# Patient Record
Sex: Male | Born: 1945
Health system: Southern US, Community
[De-identification: ages and names within clinical notes are randomized; demographics above are authoritative.]

## PROBLEM LIST (undated history)

## (undated) DIAGNOSIS — Z9861 Coronary angioplasty status: Secondary | ICD-10-CM

## (undated) DIAGNOSIS — Z720 Tobacco use: Secondary | ICD-10-CM

## (undated) DIAGNOSIS — G629 Polyneuropathy, unspecified: Secondary | ICD-10-CM

## (undated) DIAGNOSIS — N183 Chronic kidney disease, stage 3 (moderate): Secondary | ICD-10-CM

## (undated) DIAGNOSIS — I10 Essential (primary) hypertension: Secondary | ICD-10-CM

## (undated) DIAGNOSIS — I213 ST elevation (STEMI) myocardial infarction of unspecified site: Secondary | ICD-10-CM

## (undated) DIAGNOSIS — E785 Hyperlipidemia, unspecified: Secondary | ICD-10-CM

## (undated) DIAGNOSIS — R270 Ataxia, unspecified: Secondary | ICD-10-CM

## (undated) DIAGNOSIS — R209 Unspecified disturbances of skin sensation: Secondary | ICD-10-CM

## (undated) DIAGNOSIS — J189 Pneumonia, unspecified organism: Secondary | ICD-10-CM

## (undated) DIAGNOSIS — I2510 Atherosclerotic heart disease of native coronary artery without angina pectoris: Secondary | ICD-10-CM

## (undated) DIAGNOSIS — Z1211 Encounter for screening for malignant neoplasm of colon: Secondary | ICD-10-CM

## (undated) DIAGNOSIS — G510 Bell's palsy: Secondary | ICD-10-CM

## (undated) HISTORY — DX: Polyneuropathy, unspecified: G62.9

## (undated) HISTORY — DX: Hyperlipidemia, unspecified: E78.5

## (undated) HISTORY — PX: COLONOSCOPY: SHX174

## (undated) HISTORY — DX: Encounter for screening for malignant neoplasm of colon: Z12.11

## (undated) HISTORY — DX: Essential (primary) hypertension: I10

## (undated) HISTORY — DX: Tobacco use: Z72.0

## (undated) HISTORY — DX: Chronic kidney disease, stage 3 (moderate): N18.3

## (undated) HISTORY — DX: Pneumonia, unspecified organism: J18.9

## (undated) HISTORY — DX: Ataxia, unspecified: R27.0

## (undated) HISTORY — DX: ST elevation (STEMI) myocardial infarction of unspecified site: I21.3

## (undated) HISTORY — DX: Bell's palsy: G51.0

## (undated) HISTORY — DX: Unspecified disturbances of skin sensation: R20.9

---

## 1997-11-03 DIAGNOSIS — I2510 Atherosclerotic heart disease of native coronary artery without angina pectoris: Secondary | ICD-10-CM

## 1997-11-03 HISTORY — PX: CORONARY ANGIOPLASTY WITH STENT PLACEMENT: SHX49

## 1997-11-03 HISTORY — DX: Atherosclerotic heart disease of native coronary artery without angina pectoris: I25.10

## 1998-04-25 DIAGNOSIS — I2510 Atherosclerotic heart disease of native coronary artery without angina pectoris: Secondary | ICD-10-CM | POA: Insufficient documentation

## 2006-08-27 ENCOUNTER — Ambulatory Visit: Payer: Self-pay | Admitting: Internal Medicine

## 2006-09-10 ENCOUNTER — Ambulatory Visit: Payer: Self-pay | Admitting: Internal Medicine

## 2011-11-14 ENCOUNTER — Encounter: Payer: Self-pay | Admitting: Internal Medicine

## 2012-02-15 ENCOUNTER — Ambulatory Visit (INDEPENDENT_AMBULATORY_CARE_PROVIDER_SITE_OTHER): Payer: Medicare Other | Admitting: Family Medicine

## 2012-02-15 VITALS — BP 123/73 | HR 59 | Temp 98.2°F | Resp 16 | Ht 68.5 in | Wt 202.0 lb

## 2012-02-15 DIAGNOSIS — L259 Unspecified contact dermatitis, unspecified cause: Secondary | ICD-10-CM

## 2012-02-15 DIAGNOSIS — L309 Dermatitis, unspecified: Secondary | ICD-10-CM

## 2012-02-15 DIAGNOSIS — H6010 Cellulitis of external ear, unspecified ear: Secondary | ICD-10-CM

## 2012-02-15 DIAGNOSIS — H60399 Other infective otitis externa, unspecified ear: Secondary | ICD-10-CM

## 2012-02-15 MED ORDER — CEPHALEXIN 500 MG PO CAPS: 500.0000 mg | ORAL_CAPSULE | Freq: Three times a day (TID) | ORAL | Status: AC

## 2012-02-15 MED ORDER — TRIAMCINOLONE ACETONIDE 0.1 % EX CREA: TOPICAL_CREAM | Freq: Two times a day (BID) | CUTANEOUS | Status: AC

## 2012-02-15 NOTE — Progress Notes (Signed)
  Subjective:    Patient ID: Patrick Price, male    DOB: May 27, 1946, 66 y.o.   MRN: [id number]  HPI 66 yo male with bilateral ear pain. Itching for several weeks, drains/scabs, scratches more and it bleeds.  Now painful last few days.  Hearing okay.  No history of trouble with ear.  Does wear ear plugs.   Review of Systems    Negative except as per HPI  Objective:   Physical Exam Bilateral ear pinna red, mildly edematous, right worse than left.  Dry flaking skin with areas of erythema and scabbing.  Canals normal, TM's normal.        Assessment & Plan:  Cellulitis, ear from itching eczematous skin.  Keflex 500 TID for 7 days and TAC cream to use for the eczematous skin.

## 2012-06-08 ENCOUNTER — Ambulatory Visit: Payer: Self-pay | Admitting: Internal Medicine

## 2012-06-08 VITALS — BP 110/65 | HR 61 | Temp 97.5°F | Resp 16 | Ht 69.0 in | Wt 200.0 lb

## 2012-06-08 DIAGNOSIS — E789 Disorder of lipoprotein metabolism, unspecified: Secondary | ICD-10-CM

## 2012-06-08 DIAGNOSIS — F17200 Nicotine dependence, unspecified, uncomplicated: Secondary | ICD-10-CM

## 2012-06-08 DIAGNOSIS — Z0289 Encounter for other administrative examinations: Secondary | ICD-10-CM

## 2012-06-08 DIAGNOSIS — Z955 Presence of coronary angioplasty implant and graft: Secondary | ICD-10-CM

## 2012-06-08 NOTE — Progress Notes (Signed)
  Subjective:    Patient ID: Patrick Price, male    DOB: December 08, 1945, 66 y.o.   MRN: [id number]  HPI Here for DOT Hx stent coronary, lipid disorder, nicotine addiction All stable  Review of Systems See dot    Objective:   Physical Exam Normal for DOT       Assessment & Plan:  Chantix requested and given Agrees to schedule CPE

## 2012-07-28 ENCOUNTER — Encounter: Payer: Self-pay | Admitting: Internal Medicine

## 2013-03-09 ENCOUNTER — Other Ambulatory Visit (HOSPITAL_COMMUNITY): Payer: Self-pay | Admitting: Cardiovascular Disease

## 2013-03-09 DIAGNOSIS — I25810 Atherosclerosis of coronary artery bypass graft(s) without angina pectoris: Secondary | ICD-10-CM

## 2013-03-09 DIAGNOSIS — R079 Chest pain, unspecified: Secondary | ICD-10-CM

## 2013-03-24 ENCOUNTER — Ambulatory Visit (HOSPITAL_COMMUNITY)
Admission: RE | Admit: 2013-03-24 | Discharge: 2013-03-24 | Disposition: A | Discharge status: Home / Self Care | Payer: Medicare Other | Source: Ambulatory Visit | Attending: Cardiovascular Disease | Admitting: Cardiovascular Disease

## 2013-03-24 DIAGNOSIS — I2510 Atherosclerotic heart disease of native coronary artery without angina pectoris: Secondary | ICD-10-CM

## 2013-03-24 DIAGNOSIS — E669 Obesity, unspecified: Secondary | ICD-10-CM | POA: Insufficient documentation

## 2013-03-24 DIAGNOSIS — I25810 Atherosclerosis of coronary artery bypass graft(s) without angina pectoris: Secondary | ICD-10-CM | POA: Insufficient documentation

## 2013-03-24 DIAGNOSIS — I10 Essential (primary) hypertension: Secondary | ICD-10-CM | POA: Insufficient documentation

## 2013-03-24 DIAGNOSIS — R079 Chest pain, unspecified: Secondary | ICD-10-CM | POA: Insufficient documentation

## 2013-03-24 MED ORDER — TECHNETIUM TC 99M SESTAMIBI GENERIC - CARDIOLITE
10.7000 | Freq: Once | INTRAVENOUS | Status: AC | PRN
  Administered 2013-03-24: 11 via INTRAVENOUS

## 2013-03-24 MED ORDER — TECHNETIUM TC 99M SESTAMIBI GENERIC - CARDIOLITE
32.0000 | Freq: Once | INTRAVENOUS | Status: AC | PRN
  Administered 2013-03-24: 32 via INTRAVENOUS

## 2013-03-24 NOTE — Procedures (Addendum)
[facility] [address] [place] [address] [phone]  Cardiology Nuclear Med Study  Patrick Price is a 67 y.o. male     MRN : [id number]     DOB: [birth year]  Procedure Date: 03/24/2013  Nuclear Med Background Indication for Stress Test:  Stent Patency History:  CAD;STENT/PTCA--1999 Cardiac Risk Factors: History of Smoking, Hypertension, Lipids and Obesity  Symptoms:  PT DENIES SYMPTOMS   Nuclear Pre-Procedure Caffeine/Decaff Intake:  7:00pm NPO After: 5:00am   IV Site: R Antecubital  IV 0.9% NS with Angio Cath:  22g  Chest Size (in):  44" IV Started by: Emmit Pomfret, RN  Height: 5\' 9"  (1.753 m)  Cup Size: n/a  BMI:  Body mass index is 30.26 kg/(m^2). Weight:  205 lb (92.987 kg)   Tech Comments:  N/A    Nuclear Med Study 1 or 2 day study: 1 day  Stress Test Type:  Stress  Order Authorizing Provider:  Benny Lennert   Resting Radionuclide: Technetium 1m Sestamibi  Resting Radionuclide Dose: 10.7 mCi   Stress Radionuclide:  Technetium 19m Sestamibi  Stress Radionuclide Dose: 32.0 mCi           Stress Protocol Rest HR: 56 Stress HR: 141  Rest BP: 116/66 Stress BP: 169/71  Exercise Time (min): 11:00 METS: 12.9   Predicted Max HR: 154 bpm % Max HR: 91.56 bpm Rate Pressure Product: 21308  Dose of Adenosine (mg):  n/a Dose of Lexiscan: n/a mg  Dose of Atropine (mg): n/a Dose of Dobutamine: n/a mcg/kg/min (at max HR)  Stress Test Technologist: Esperanza Sheets, CCT Nuclear Technologist: Gonzella Lex, CNMT   Rest Procedure:  Myocardial perfusion imaging was performed at rest 45 minutes following the intravenous administration of Technetium 85m Sestamibi. Stress Procedure:  The patient performed treadmill exercise using a Bruce  Protocol for 11:00 minutes. The patient stopped due to SOB and Fatigue and denied any chest pain.  There were NSST-T wave changes.  Technetium 39m Sestamibi was injected at peak exercise  and myocardial perfusion imaging was performed after a brief delay.  Transient Ischemic Dilatation (Normal <1.22):  0.94 Lung/Heart Ratio (Normal <0.45):  0.34 QGS EDV:  77 ml QGS ESV:  23 ml LV Ejection Fraction: 70%  Signed by       Rest ECG: NSR - Normal EKG  Stress ECG: Significant ST abnormalities consistent with ischemia.  QPS Raw Data Images:  Normal; no motion artifact; normal heart/lung ratio. Stress Images:  Normal homogeneous uptake in all areas of the myocardium. Rest Images:  Normal homogeneous uptake in all areas of the myocardium. Subtraction (SDS):  No evidence of ischemia.  Impression Exercise Capacity:  Good exercise capacity. BP Response:  Normal blood pressure response. Clinical Symptoms:  No significant symptoms noted. ECG Impression:  Significant ST abnormalities consistent with ischemia. Comparison with Prior Nuclear Study: No significant change from previous study  Overall Impression:  Normal stress nuclear study.  LV Wall Motion:  NL LV Function; NL Wall Motion   Runell Gess, MD  03/24/2013 5:46 PM

## 2013-03-28 ENCOUNTER — Telehealth: Payer: Self-pay | Admitting: *Deleted

## 2013-03-28 NOTE — Telephone Encounter (Signed)
Informed patient  nuc stress test shows no changes from prior test.

## 2013-04-22 NOTE — Progress Notes (Signed)
Quick Note:  Informed patient of normal stress test results per Dr. Tresa Endo. ______

## 2013-06-08 ENCOUNTER — Other Ambulatory Visit: Payer: Self-pay

## 2013-08-19 ENCOUNTER — Telehealth: Payer: Self-pay | Admitting: Cardiovascular Disease

## 2013-08-19 DIAGNOSIS — R5381 Other malaise: Secondary | ICD-10-CM

## 2013-08-19 DIAGNOSIS — Z79899 Other long term (current) drug therapy: Secondary | ICD-10-CM

## 2013-08-19 DIAGNOSIS — E785 Hyperlipidemia, unspecified: Secondary | ICD-10-CM

## 2013-08-19 NOTE — Telephone Encounter (Signed)
Returned patient's call. Spoke with Burna Mortimer - Dr. Landry Dyke nurse, and confirmed order for complete set of labs prior to OV (cbc, cmet, tsh, lipid). Lab order placed, slips mailed to patient. Patient stated he would have labs done Monday or Tuesday prior to OV

## 2013-08-19 NOTE — Telephone Encounter (Signed)
Pt needs to have blood work done. His appointment with Dr. Tresa Endo is on Thursday the 23rd @ 10:15 he would like to have the order's sent to [facility] but wants someone to call him to let him know so that he can go on Monday or Tuesday

## 2013-08-23 LAB — LIPID PANEL
Cholesterol: 141 mg/dL (ref 0–200)
HDL: 30 mg/dL — ABNORMAL LOW (ref >39)
LDL Cholesterol: 69 mg/dL (ref 0–99)
Total CHOL/HDL Ratio: 4.7 Ratio
Triglycerides: 212 mg/dL — ABNORMAL HIGH (ref <150)
VLDL: 42 mg/dL — ABNORMAL HIGH (ref 0–40)

## 2013-08-23 LAB — CBC
HCT: 46 % (ref 39.0–52.0)
Hemoglobin: 15.8 g/dL (ref 13.0–17.0)
MCH: 30.8 pg (ref 26.0–34.0)
MCHC: 34.3 g/dL (ref 30.0–36.0)
MCV: 89.7 fL (ref 78.0–100.0)
Platelets: 162 10*3/uL (ref 150–400)
RBC: 5.13 MIL/uL (ref 4.22–5.81)
RDW: 13.9 % (ref 11.5–15.5)
WBC: 8.2 10*3/uL (ref 4.0–10.5)

## 2013-08-23 LAB — COMPREHENSIVE METABOLIC PANEL
ALT: 30 U/L (ref 0–53)
AST: 23 U/L (ref 0–37)
Albumin: 4.3 g/dL (ref 3.5–5.2)
Alkaline Phosphatase: 66 U/L (ref 39–117)
BUN: 17 mg/dL (ref 6–23)
CO2: 24 mEq/L (ref 19–32)
Calcium: 8.9 mg/dL (ref 8.4–10.5)
Chloride: 107 mEq/L (ref 96–112)
Creat: 1.14 mg/dL (ref 0.50–1.35)
Glucose, Bld: 94 mg/dL (ref 70–99)
Potassium: 4.2 mEq/L (ref 3.5–5.3)
Sodium: 138 mEq/L (ref 135–145)
Total Bilirubin: 0.7 mg/dL (ref 0.3–1.2)
Total Protein: 6.6 g/dL (ref 6.0–8.3)

## 2013-08-23 LAB — TSH: TSH: 1.738 u[IU]/mL (ref 0.350–4.500)

## 2013-08-25 ENCOUNTER — Ambulatory Visit (INDEPENDENT_AMBULATORY_CARE_PROVIDER_SITE_OTHER): Payer: Medicare Other | Admitting: Cardiovascular Disease

## 2013-08-31 ENCOUNTER — Encounter: Payer: Self-pay | Admitting: *Deleted

## 2013-09-02 ENCOUNTER — Encounter: Payer: Self-pay | Admitting: Cardiovascular Disease

## 2013-09-02 ENCOUNTER — Ambulatory Visit (INDEPENDENT_AMBULATORY_CARE_PROVIDER_SITE_OTHER): Payer: Medicare Other | Admitting: Cardiovascular Disease

## 2013-09-02 VITALS — BP 104/68 | HR 59 | Ht 69.0 in | Wt 190.5 lb

## 2013-09-02 DIAGNOSIS — E782 Mixed hyperlipidemia: Secondary | ICD-10-CM

## 2013-09-02 DIAGNOSIS — E785 Hyperlipidemia, unspecified: Secondary | ICD-10-CM

## 2013-09-02 DIAGNOSIS — I2510 Atherosclerotic heart disease of native coronary artery without angina pectoris: Secondary | ICD-10-CM

## 2013-09-02 NOTE — Progress Notes (Signed)
Patient ID: Patrick Price, male   DOB: Sep 26, 1946, 67 y.o.   MRN: [id number]     HPI: Patrick Price is a 67 y.o. male who presents for 1 year cardiology evaluation.  In 1999 Patrick Price underwent PTCA and stenting of his right coronary artery. He has a long-standing history of prior tobacco use but quit smoking approximately 7 years ago when he turns 60. He does have a history of hyperlipidemia. Since I last saw him, he underwent a two-year followup nuclear perfusion study in May 2014 which continued to demonstrate normal perfusion without scar or ischemia on his medical therapy.  He has remained active. He denies chest pain. He denies shortness of breath. He exercises at the gym at least 3 days per week.  He recently had followup laboratory which showed a sodium of 138 he had normal renal function and liver function studies. Hemoglobin and hematocrit were 15.8 and 46. Total is improved at 141 but his triglycerides have increased from one year ago and were now 212 with a reduced HDL of 30 and an increased VLDL at 42 even though his LDL cholesterol was 69. Findings are as consistent with an atherogenic dyslipidemia profile. He never did start  Zetia last year but has been taking atorvastatin 40 mg and fish oil 2000 mg per day. He has tolerated his metoprolol 25 mg long acting regimen. He denies recent chest pain. He denies shortness of breath. He denies change in exercise tolerance  Past Medical History  Diagnosis Date  . Hyperlipidemia   . Hypertension   . Stented coronary artery   . Coronary artery disease 01/16/11    nuclear stress test- low risk scan. no ischemia or infarct/scar is seen EF 77%    Past Surgical History  Procedure Laterality Date  . Coronary angioplasty      No Known Allergies  Current Outpatient Prescriptions  Medication Sig Dispense Refill  . aspirin 325 MG tablet Take 325 mg by mouth daily.      [name] atorvastatin (LIPITOR) 40 MG tablet Take 1 tablet by mouth daily.      [name] b  complex vitamins tablet Take 1 tablet by mouth daily.      . fish oil-omega-3 fatty acids 1000 MG capsule Take 2 g by mouth daily.      . metoprolol succinate (TOPROL-XL) 25 MG 24 hr tablet Take 1 tablet by mouth daily.      . Multiple Vitamin (MULTIVITAMIN WITH MINERALS) TABS tablet Take 1 tablet by mouth daily.       No current facility-administered medications for this visit.    History   Social History  . Marital Status: Married    Spouse Name: N/A    Number of Children: N/A  . Years of Education: N/A   Occupational History  . Not on file.   Social History Main Topics  . Smoking status: Former Smoker -- 0.10 packs/day    Types: Cigarettes    Quit date: 02/15/2007  . Smokeless tobacco: Not on file  . Alcohol Use: Not on file  . Drug Use: Not on file  . Sexual Activity: Not on file   Other Topics Concern  . Not on file   Social History Narrative  . No narrative on file   Socially he is married and has 2 children. 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  No family history on file.  ROS is negative for fevers,  chills or night sweats. He denies visual symptoms. He denies cough or sputum production. He denies wheezing. He denies presyncope or syncope. There is no chest pain. There is no shortness of breath. He denies abdominal pain. There is no nausea vomiting or diarrhea. He denies GU or GI symptoms. He denies endocrine abnormalities. He does have hyperlipidemia. He denies claudication. He denies neurologic symptoms.  Other comprehensive 12 point system review is negative.  PE BP 104/68  Pulse 59  Ht 5\' 9"  (1.753 m)  Wt 190 lb 8 oz (86.41 kg)  BMI 28.12 kg/m2  General: Alert, oriented, no distress.  Skin: normal turgor, no rashes HEENT: Normocephalic, atraumatic. Pupils round and reactive; sclera anicteric;no lid lag.  Nose without nasal septal hypertrophy Mouth/Parynx benign; Mallinpatti scale 2 Neck: No JVD, no  carotid briuts Lungs: clear to ausculatation and percussion; no wheezing or rales Heart: RRR, s1 s2 normal 1/6 sem Abdomen: soft, nontender; no hepatosplenomehaly, BS+; abdominal aorta nontender and not dilated by palpation. Pulses 2+ Extremities: no clubbing cyanosis or edema, Homan's sign negative  Neurologic: grossly nonfocal Psychologic: normal affect and mood.  ECG:  Sinus rhythm at 59 beats per minute. Nonspecific T changes.  LABS:  BMET    Component Value Date/Time   NA 138 08/23/2013 0935   K 4.2 08/23/2013 0935   CL 107 08/23/2013 0935   CO2 24 08/23/2013 0935   GLUCOSE 94 08/23/2013 0935   BUN 17 08/23/2013 0935   CREATININE 1.14 08/23/2013 0935   CALCIUM 8.9 08/23/2013 0935     Hepatic Function Panel     Component Value Date/Time   PROT 6.6 08/23/2013 0935   ALBUMIN 4.3 08/23/2013 0935   AST 23 08/23/2013 0935   ALT 30 08/23/2013 0935   ALKPHOS 66 08/23/2013 0935   BILITOT 0.7 08/23/2013 0935     CBC    Component Value Date/Time   WBC 8.2 08/23/2013 0935   RBC 5.13 08/23/2013 0935   HGB 15.8 08/23/2013 0935   HCT 46.0 08/23/2013 0935   PLT 162 08/23/2013 0935   MCV 89.7 08/23/2013 0935   MCH 30.8 08/23/2013 0935   MCHC 34.3 08/23/2013 0935   RDW 13.9 08/23/2013 0935     BNP No results found for this basename: probnp    Lipid Panel     Component Value Date/Time   CHOL 141 08/23/2013 0935   TRIG 212* 08/23/2013 0935   HDL 30* 08/23/2013 0935   CHOLHDL 4.7 08/23/2013 0935   VLDL 42* 08/23/2013 0935   LDLCALC 69 08/23/2013 0935     RADIOLOGY: No results found.    ASSESSMENT AND PLAN:  From a cardiac perspective, Patrick Price is doing well now 15 years status post stenting of his right coronary artery. His most recent nuclear study is unchanged from previously and continues to be normal. His triglycerides have increased over the year. I have suggested he increase his visual tube 2 capsules twice a day. We discussed reduction of  carbohydrate intake. He denies eating any sweets or awareness of sugar intake. He'll continue to exercise regularly. In 6 months, I will check an NMR lipoprofile for further evaluation of particle number and it seemed that. I will review this and adjust as will be made if necessary. Otherwise I will see him in one year for cardiology reevaluation.   Lennette Bihari, MD, [facility]  09/02/2013 9:18 AM

## 2013-09-02 NOTE — Patient Instructions (Signed)
Your physician has recommended you make the following change in your medication: Increase the fish oil to 2 twice daily.   Your physician recommends that you schedule a follow-up appointment in: 1 YEAR.

## 2013-09-08 ENCOUNTER — Other Ambulatory Visit: Payer: Self-pay

## 2013-09-22 ENCOUNTER — Encounter: Payer: Self-pay | Admitting: Cardiovascular Disease

## 2013-10-01 ENCOUNTER — Ambulatory Visit (INDEPENDENT_AMBULATORY_CARE_PROVIDER_SITE_OTHER): Payer: Medicare Other | Admitting: Internal Medicine

## 2013-10-01 VITALS — BP 114/62 | HR 58 | Temp 98.0°F | Resp 16 | Ht 69.0 in | Wt 190.0 lb

## 2013-10-01 DIAGNOSIS — M7040 Prepatellar bursitis, unspecified knee: Secondary | ICD-10-CM

## 2013-10-01 DIAGNOSIS — M109 Gout, unspecified: Secondary | ICD-10-CM

## 2013-10-01 DIAGNOSIS — M7041 Prepatellar bursitis, right knee: Secondary | ICD-10-CM

## 2013-10-01 LAB — POCT CBC
Granulocyte percent: 64.4 %G (ref 37–80)
HCT, POC: 41.9 % — AB (ref 43.5–53.7)
Hemoglobin: 13.3 g/dL — AB (ref 14.1–18.1)
Lymph, poc: 2.1 (ref 0.6–3.4)
MCH, POC: 30.7 pg (ref 27–31.2)
MCHC: 31.7 g/dL — AB (ref 31.8–35.4)
MCV: 96.7 fL (ref 80–97)
MID (cbc): 0.4 (ref 0–0.9)
MPV: 10.1 fL (ref 0–99.8)
POC Granulocyte: 4.6 (ref 2–6.9)
POC LYMPH PERCENT: 29.4 %L (ref 10–50)
POC MID %: 6.2 %M (ref 0–12)
Platelet Count, POC: 128 10*3/uL — AB (ref 142–424)
RBC: 4.33 M/uL — AB (ref 4.69–6.13)
RDW, POC: 14.1 %
WBC: 7.1 10*3/uL (ref 4.6–10.2)

## 2013-10-01 LAB — URIC ACID: Uric Acid, Serum: 6.2 mg/dL (ref 4.0–7.8)

## 2013-10-01 MED ORDER — PREDNISONE 20 MG PO TABS: ORAL_TABLET | ORAL | Status: DC

## 2013-10-01 NOTE — Progress Notes (Addendum)
Subjective:    Patient ID: Patrick Price, male    DOB: February 21, 1946, 67 y.o.   MRN: [id number] This chart was scribed for Ellamae Sia, MD by Clydene Laming, ED Scribe. This patient was seen in room 3 and the patient's care was started at 11:29 AM. HPI HPI Comments: Patrick Price is a 67 y.o. male who presents to the [facility] complaining of right knee pain onset one week ago. Pt denies any trauma. He states he can barely move the right knee at this point. There is pain to palpation although he states it does not hurt to walk on it. Pt was on a high protein diet and lost 30 lbs in 10 weeks. Tender to light touch like garments and sheets. No hx gout   Patient Active Problem List   Diagnosis Date Noted   Hyperlipidemia LDL goal < 70 09/02/2013   Stented coronary artery 06/08/2012   Lipid disorder 06/08/2012   Nicotine addiction 06/08/2012    Past Surgical History  Procedure Laterality Date   Coronary angioplasty     No Known Allergies Prior to Admission medications   Medication Sig Start Date End Date Taking? Authorizing Provider  aspirin 325 MG tablet Take 325 mg by mouth daily.   Yes Historical Provider, MD  atorvastatin (LIPITOR) 40 MG tablet Take 1 tablet by mouth daily. 07/12/13  Yes Historical Provider, MD  b complex vitamins tablet Take 1 tablet by mouth daily.   Yes Historical Provider, MD  fish oil-omega-3 fatty acids 1000 MG capsule Take 2 g by mouth daily.   Yes Historical Provider, MD  metoprolol succinate (TOPROL-XL) 25 MG 24 hr tablet Take 1 tablet by mouth daily. 07/12/13  Yes Historical Provider, MD  Multiple Vitamin (MULTIVITAMIN WITH MINERALS) TABS tablet Take 1 tablet by mouth daily.   Yes Historical Provider, MD      Review of Systems  Constitutional: Negative for fever.  Musculoskeletal: Positive for joint swelling and myalgias. Negative for back pain.  Skin: Negative for rash.       Objective:   Physical Exam  Nursing note and vitals  reviewed. Constitutional: He is oriented to person, place, and time. He appears well-developed and well-nourished.  HENT:  Head: Normocephalic and atraumatic.  Cardiovascular: Normal rate and regular rhythm.   Pulmonary/Chest: Effort normal.  Abdominal: Bowel sounds are normal.  Musculoskeletal: He exhibits no edema.  R Knee sl swollen over prepatellar area with exquisite tenderness to touch. Pain w/ extens knee. Pat ballots freely Neg lax to stressors McM neg  Neurological: He is alert and oriented to person, place, and time. He has normal strength. No cranial nerve deficit or sensory deficit.  Skin: Skin is warm and dry. No rash noted.  Psychiatric: He has a normal mood and affect.   Filed Vitals:   10/01/13 1120  BP: 114/62  Pulse: 58  Temp: 98 F (36.7 C)  Resp: 16  Height: 5\' 9"  (1.753 m)  Weight: 190 lb (86.183 kg)  SpO2: 99%      Results for orders placed in visit on 10/01/13  POCT CBC      Result Value Range   WBC 7.1  4.6 - 10.2 K/uL   Lymph, poc 2.1  0.6 - 3.4   POC LYMPH PERCENT 29.4  10 - 50 %L   MID (cbc) 0.4  0 - 0.9   POC MID % 6.2  0 - 12 %M   POC Granulocyte 4.6  2 - 6.9  Granulocyte percent 64.4  37 - 80 %G   RBC 4.33 (*) 4.69 - 6.13 M/uL   Hemoglobin 13.3 (*) 14.1 - 18.1 g/dL   HCT, POC 82.9 (*) 56.2 - 53.7 %   MCV 96.7  80 - 97 fL   MCH, POC 30.7  27 - 31.2 pg   MCHC 31.7 (*) 31.8 - 35.4 g/dL   RDW, POC 13.0     Platelet Count, POC 128 (*) 142 - 424 K/uL   MPV 10.1  0 - 99.8 fL  recent cmet at PCP wnl     Assessment & Plan:  11:36 AM- Discussed treatment plan with pt at bedside. Pt verbalized understanding and agreement with plan.  I have completed the patient encounter in its entirety as documented by the scribe, with editing by me where necessary. Robert P. Merla Riches, M.D.  Prepatellar bursitis, right - Plan: POCT CBC, Uric acid  Gout  Meds ordered this encounter  Medications   predniSONE (DELTASONE) 20 MG tablet    Sig:  3/3/2/2/1/1 single daily dose for 6 days    Dispense:  12 tablet    Refill:  0

## 2013-10-02 ENCOUNTER — Encounter: Payer: Self-pay | Admitting: Internal Medicine

## 2013-12-26 ENCOUNTER — Other Ambulatory Visit: Payer: Self-pay | Admitting: Cardiovascular Disease

## 2013-12-26 NOTE — Telephone Encounter (Signed)
E sentrx 

## 2014-01-26 ENCOUNTER — Encounter: Payer: Self-pay | Admitting: *Deleted

## 2014-01-26 ENCOUNTER — Other Ambulatory Visit: Payer: Self-pay | Admitting: *Deleted

## 2014-01-26 DIAGNOSIS — I2510 Atherosclerotic heart disease of native coronary artery without angina pectoris: Secondary | ICD-10-CM

## 2014-01-26 DIAGNOSIS — E782 Mixed hyperlipidemia: Secondary | ICD-10-CM

## 2014-05-20 ENCOUNTER — Ambulatory Visit (INDEPENDENT_AMBULATORY_CARE_PROVIDER_SITE_OTHER): Payer: Medicare Other | Admitting: Emergency Medicine

## 2014-05-20 VITALS — BP 118/72 | HR 66 | Temp 98.0°F | Resp 18 | Ht 69.0 in | Wt 198.0 lb

## 2014-05-20 DIAGNOSIS — T1590XA Foreign body on external eye, part unspecified, unspecified eye, initial encounter: Secondary | ICD-10-CM

## 2014-05-20 MED ORDER — TOBRAMYCIN 0.3 % OP SOLN: 2.0000 [drp] | OPHTHALMIC | Status: DC

## 2014-05-20 MED ORDER — CIPROFLOXACIN HCL 0.3 % OP SOLN: 2.0000 [drp] | OPHTHALMIC | Status: DC

## 2014-05-20 NOTE — Patient Instructions (Signed)
Eye, Foreign Body The term foreign body refers to any object near, on the surface of or in the eye that should not be there. A foreign body may be a small speck of dirt or dust, a hair or eyelash, a splinter or any object. CAUSES  Foreign bodies can get in the eye by:  Flying pieces of something that was broken or destroyed (debris).  A sudden injury (trauma) to the eye. SYMPTOMS  Symptoms depend on what the foreign body is and where it is in the eye. The most common locations are:  On the inner surface of the upper or lower eyelids or on the covering of the white part of the eye (conjunctiva). Symptoms in this location are:  Irritating and painful, especially when blinking.  Feeling like something is in the eye.  On the surface of the clear covering on the front of the eye (cornea). A corneal foreign body has symptoms that:  Are painful and irritating since the cornea is very sensitive.  Form small "rust rings" around a metallic foreign body. Metallic foreign bodies stick more firmly to the surface of the cornea.  Inside the eyeball. Infection can happen fast and can be hard to treat with antibiotics. This is an extremely dangerous situation. Foreign bodies inside the eye can threaten vision. A person may even loose their eye. Foreign bodies inside the eye may cause:  Great pain.  Immediate loss of vision. DIAGNOSIS  Foreign bodies are found during an exam by an eye specialist. Those that are on the eyelids, conjunctiva or cornea are usually (but not always) easily found. When a foreign body is inside the eyeball, a cataract may form almost right away. This makes it hard for an ophthalmologist to find the foreign body. Special tests may be needed, including ultrasound testing, X-rays and CT scans. TREATMENT   Foreign bodies that are on the eyelids, conjunctiva or cornea are often removed easily and painlessly.  If the foreign body has caused a scratch or abrasion of the cornea,  antibiotic drops, ointments and/or a tight patch called a "pressure patch" may be needed. Follow-up exams will be needed for several days until the abrasion heals.  Surgery is needed right away if the foreign body is inside the eyeball. This is a medical emergency. An antibiotic therapy will likely be given to stop an infection. HOME CARE INSTRUCTIONS  The use of eye patches is not universal. Their use varies from state to state and from caregiver to caregiver. If an eye patch was applied:  Keep the eye patch on for as long as directed by your caregiver until the follow-up appointment.  Do not remove the patch to put in medications unless instructed to do so. When replacing the patch, retape it as it was before. Follow the same procedure if the patch becomes loose.  WARNING: Do not drive or operate machinery while the eye is patched. The ability to judge distances will be impaired.  Only take over-the-counter or prescription medicines for pain, discomfort or fever as directed by the caregiver. If no eye patch was applied:  Keep the eye closed as much as possible. Do not rub the eye.  Wear dark glasses as needed to protect the eyes from bright light.  Do not wear contact lenses until the eye feels normal again, or as instructed.  Wear protective eye covering if there is a risk of eye injury. This is important when working with high speed tools.  Only take over-the-counter or   prescription medicines for pain, discomfort or fever as directed by the caregiver. SEEK IMMEDIATE MEDICAL CARE IF:   Pain increases in the eye or the vision changes.  You or your child has problems with the eye patch.  The injury to the eye appears to be getting larger.  There is discharge from the injured eye.  Swelling and/or soreness (inflammation) develops around the affected eye.  You or your child has an oral temperature above 102 F (38.9 C), not controlled by medicine.  Your baby is older than 3  months with a rectal temperature of 102 F (38.9 C) or higher.  Your baby is 3 months old or younger with a rectal temperature of 100.4 F (38 C) or higher. MAKE SURE YOU:   Understand these instructions.  Will watch your condition.  Will get help right away if you are not doing well or get worse. Document Released: 10/20/2005 Document Revised: 01/12/2012 Document Reviewed: 03/17/2013 ExitCare Patient Information 2015 [employer]. This information is not intended to replace advice given to you by your health care provider. Make sure you discuss any questions you have with your health care provider.  

## 2014-05-20 NOTE — Progress Notes (Signed)
[facility] [address], [place] [id number] [phone]  Date:  05/20/2014   Name:  Patrick Price   DOB:  10/16/1946   MRN:  [id number]  PCP:  Tally DueGUEST, CHRIS WARREN, MD    Chief Complaint: Conjunctivitis   History of Present Illness:  Patrick MinkJack Hao is a 68 y.o. 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  No photophobia.  Scant drainange.  No known FB eye.   No improvement with over the counter medications or other home remedies. Denies other complaint or health concern today.   Patient Active Problem List   Diagnosis Date Noted  . Hyperlipidemia LDL goal < 70 09/02/2013  . Stented coronary artery 06/08/2012  . Lipid disorder 06/08/2012  . Nicotine addiction 06/08/2012    Past Medical History  Diagnosis Date  . Hyperlipidemia   . Hypertension   . Stented coronary artery   . Coronary artery disease 01/16/11    nuclear stress test- low risk scan. no ischemia or infarct/scar is seen EF 77%    Past Surgical History  Procedure Laterality Date  . Coronary angioplasty      History  Substance Use Topics  . Smoking status: Former Smoker -- 0.10 packs/day    Types: Cigarettes    Quit date: 02/15/2007  . Smokeless tobacco: Not on file  . Alcohol Use: Not on file    History reviewed. No pertinent family history.  No Known Allergies  Medication list has been reviewed and updated.  Current Outpatient Prescriptions on File Prior to Visit  Medication Sig Dispense Refill  . aspirin 325 MG tablet Take 325 mg by mouth daily.      [name]. atorvastatin (LIPITOR) 40 MG tablet Take 1 tablet by mouth daily.      [name]. b complex vitamins tablet Take 1 tablet by mouth daily.      . fish oil-omega-3 fatty acids 1000 MG capsule Take 2 g by mouth daily.      . metoprolol succinate (TOPROL-XL) 25 MG 24 hr tablet TAKE 1 TABLET ONCE DAILY.  60 tablet  6  . Multiple Vitamin  (MULTIVITAMIN WITH MINERALS) TABS tablet Take 1 tablet by mouth daily.      . predniSONE (DELTASONE) 20 MG tablet 3/3/2/2/1/1 single daily dose for 6 days  12 tablet  0   No current facility-administered medications on file prior to visit.    Review of Systems:  As per HPI, otherwise negative.    Physical Examination: Filed Vitals:   05/20/14 1352  BP: 118/72  Pulse: 66  Temp: 98 F (36.7 C)  Resp: 18   Filed Vitals:   05/20/14 1352  Height: 5\' 9"  (1.753 m)  Weight: 198 lb (89.812 kg)   Body mass index is 29.23 kg/(m^2). Ideal Body Weight: Weight in (lb) to have BMI = 25: 168.9   GEN: WDWN, NAD, Non-toxic, Alert & Oriented x 3 HEENT: Atraumatic, Normocephalic.  Ears and Nose: No external deformity. EXTR: No clubbing/cyanosis/edema NEURO: Normal gait.  PSYCH: Normally interactive. Conversant. Not depressed or anxious appearing.  Calm demeanor.  No visible FB Irrigated 1000 ml nss each eye with morgan lense  Assessment and Plan: Bilateral foreign body Signed,  Phillips OdorJeffery Shaela Boer, MD

## 2014-07-04 ENCOUNTER — Other Ambulatory Visit: Payer: Self-pay | Admitting: Cardiovascular Disease

## 2014-07-04 NOTE — Telephone Encounter (Signed)
Rx was sent to pharmacy electronically. 

## 2014-07-21 ENCOUNTER — Encounter: Payer: Self-pay | Admitting: *Deleted

## 2014-07-27 ENCOUNTER — Encounter: Payer: Self-pay | Admitting: *Deleted

## 2014-08-08 ENCOUNTER — Telehealth: Payer: Self-pay | Admitting: *Deleted

## 2014-08-08 NOTE — Telephone Encounter (Signed)
[facility] (generic info-name, title, location, contact info)

## 2014-10-12 ENCOUNTER — Ambulatory Visit (INDEPENDENT_AMBULATORY_CARE_PROVIDER_SITE_OTHER): Payer: Medicare Other | Admitting: Emergency Medicine

## 2014-10-12 VITALS — BP 118/56 | HR 70 | Temp 98.2°F | Resp 16 | Ht 69.0 in | Wt 201.6 lb

## 2014-10-12 DIAGNOSIS — J209 Acute bronchitis, unspecified: Secondary | ICD-10-CM

## 2014-10-12 DIAGNOSIS — J029 Acute pharyngitis, unspecified: Secondary | ICD-10-CM

## 2014-10-12 DIAGNOSIS — H109 Unspecified conjunctivitis: Secondary | ICD-10-CM

## 2014-10-12 MED ORDER — OFLOXACIN 0.3 % OP SOLN: 1.0000 [drp] | Freq: Four times a day (QID) | OPHTHALMIC | Status: DC

## 2014-10-12 MED ORDER — HYDROCOD POLST-CHLORPHEN POLST 10-8 MG/5ML PO LQCR: 5.0000 mL | Freq: Two times a day (BID) | ORAL | Status: DC | PRN

## 2014-10-12 MED ORDER — CLARITHROMYCIN 500 MG PO TABS: 500.0000 mg | ORAL_TABLET | Freq: Two times a day (BID) | ORAL | Status: DC

## 2014-10-12 NOTE — Patient Instructions (Signed)

## 2014-10-12 NOTE — Progress Notes (Signed)
[facility] [address], [place] [id number] [phone]  Date:  10/12/2014   Name:  Patrick Price   DOB:  03/01/1946   MRN:  [id number]  PCP:  Tally DueGUEST, CHRIS WARREN, MD    Chief Complaint: URI; Eye Pain; and Cough   History of Present Illness:  Patrick Price is a 68 y.o. very pleasant male patient who presents with the following:  Had a "cold" three days ago.  Now has a persistent non productive cough.  No wheezing or shortness of breath No nasal congestion or drainage.  No post nasal drip. No fever or chills Now has tearing and redness in his eyes and gluing this morning. No history of allergies. No improvement with over the counter medications or other home remedies.  Denies other complaint or health concern today.   Patient Active Problem List   Diagnosis Date Noted  . Hyperlipidemia LDL goal < 70 09/02/2013  . Stented coronary artery 06/08/2012  . Lipid disorder 06/08/2012  . Nicotine addiction 06/08/2012    Past Medical History  Diagnosis Date  . Hyperlipidemia   . Hypertension   . Stented coronary artery   . Coronary artery disease 01/16/11    nuclear stress test- low risk scan. no ischemia or infarct/scar is seen EF 77%    Past Surgical History  Procedure Laterality Date  . Coronary angioplasty      History  Substance Use Topics  . Smoking status: Former Smoker -- 0.10 packs/day    Types: Cigarettes    Quit date: 02/15/2007  . Smokeless tobacco: Not on file  . Alcohol Use: Not on file    No family history on file.  No Known Allergies  Medication list has been reviewed and updated.  Current Outpatient Prescriptions on File Prior to Visit  Medication Sig Dispense Refill  . aspirin 325 MG tablet Take 325 mg by mouth daily.    [name]. atorvastatin (LIPITOR) 40 MG tablet TAKE 1 TABLET BY MOUTH AT BEDTIME 90 tablet 0  . b complex vitamins tablet Take 1 tablet by mouth daily.    . ciprofloxacin (CILOXAN) 0.3 % ophthalmic solution Place 2  drops into both eyes every 4 (four) hours while awake. Administer 1 drop, every 2 hours, while awake, for 2 days. Then 1 drop, every 4 hours, while awake, for the next 5 days. 5 mL 0  . fish oil-omega-3 fatty acids 1000 MG capsule Take 2 g by mouth daily.    . metoprolol succinate (TOPROL-XL) 25 MG 24 hr tablet TAKE 1 TABLET ONCE DAILY. 60 tablet 6  . Multiple Vitamin (MULTIVITAMIN WITH MINERALS) TABS tablet Take 1 tablet by mouth daily.    [name]. tobramycin (TOBREX) 0.3 % ophthalmic solution Place 2 drops into both eyes every 4 (four) hours. 5 mL 0   No current facility-administered medications on file prior to visit.    Review of Systems:  As per HPI, otherwise negative.    Physical Examination: Filed Vitals:   10/12/14 1003  BP: 118/56  Pulse: 70  Temp: 98.2 F (36.8 C)  Resp: 16   Filed Vitals:   10/12/14 1003  Height: 5\' 9"  (1.753 m)  Weight: 201 lb 9.6 oz (91.445 kg)   Body mass index is 29.76 kg/(m^2). Ideal Body Weight: Weight in (lb) to have BMI = 25: 168.9  GEN: WDWN, NAD, Non-toxic, A & O x 3  Persistent cough HEENT: Atraumatic, Normocephalic. Neck supple. No masses, No LAD.  Injected sclera and  crusty lids  Oropharynx beefy red Ears and Nose: No external deformity. CV: RRR, No M/G/R. No JVD. No thrill. No extra heart sounds. PULM: CTA B, no wheezes, crackles, rhonchi. No retractions. No resp. distress. No accessory muscle use. ABD: S, NT, ND, +BS. No rebound. No HSM. EXTR: No c/c/e NEURO Normal gait.  PSYCH: Normally interactive. Conversant. Not depressed or anxious appearing.  Calm demeanor.    Assessment and Plan: Conjunctivitis Pharyngitis Bronchitis Ocuflox tussionex biaxin  Signed,  Phillips OdorJeffery Vipul Cafarelli, MD

## 2014-10-28 ENCOUNTER — Other Ambulatory Visit: Payer: Self-pay | Admitting: Cardiovascular Disease

## 2014-10-30 NOTE — Telephone Encounter (Signed)
Rx(s) sent to pharmacy electronically.  

## 2014-11-03 HISTORY — PX: CARDIOVASCULAR STRESS TEST: SHX262

## 2015-01-01 ENCOUNTER — Encounter: Payer: Self-pay | Admitting: *Deleted

## 2015-01-29 ENCOUNTER — Other Ambulatory Visit: Payer: Self-pay | Admitting: Cardiovascular Disease

## 2015-01-29 NOTE — Telephone Encounter (Signed)
Rx refill sent to patient pharmacy   

## 2015-02-01 ENCOUNTER — Emergency Department (HOSPITAL_COMMUNITY): Payer: Medicare Other

## 2015-02-01 ENCOUNTER — Encounter: Payer: Self-pay | Admitting: Family Medicine

## 2015-02-01 ENCOUNTER — Emergency Department (HOSPITAL_COMMUNITY)
Admission: EM | Admit: 2015-02-01 | Discharge: 2015-02-01 | Disposition: A | Discharge status: Home / Self Care | Payer: Medicare Other | Attending: Emergency Medicine | Admitting: Emergency Medicine

## 2015-02-01 ENCOUNTER — Ambulatory Visit (INDEPENDENT_AMBULATORY_CARE_PROVIDER_SITE_OTHER): Payer: Medicare Other | Admitting: Family Medicine

## 2015-02-01 ENCOUNTER — Encounter (HOSPITAL_COMMUNITY): Payer: Self-pay | Admitting: Family Medicine

## 2015-02-01 VITALS — BP 160/84 | HR 65 | Temp 98.3°F | Resp 16 | Ht 69.0 in | Wt 201.0 lb

## 2015-02-01 DIAGNOSIS — Z87891 Personal history of nicotine dependence: Secondary | ICD-10-CM | POA: Insufficient documentation

## 2015-02-01 DIAGNOSIS — R519 Headache, unspecified: Secondary | ICD-10-CM

## 2015-02-01 DIAGNOSIS — G510 Bell's palsy: Secondary | ICD-10-CM | POA: Insufficient documentation

## 2015-02-01 DIAGNOSIS — Z955 Presence of coronary angioplasty implant and graft: Secondary | ICD-10-CM | POA: Insufficient documentation

## 2015-02-01 DIAGNOSIS — I10 Essential (primary) hypertension: Secondary | ICD-10-CM

## 2015-02-01 DIAGNOSIS — Z79899 Other long term (current) drug therapy: Secondary | ICD-10-CM | POA: Diagnosis not present

## 2015-02-01 DIAGNOSIS — Z7982 Long term (current) use of aspirin: Secondary | ICD-10-CM | POA: Diagnosis not present

## 2015-02-01 DIAGNOSIS — R29810 Facial weakness: Secondary | ICD-10-CM | POA: Diagnosis not present

## 2015-02-01 DIAGNOSIS — E785 Hyperlipidemia, unspecified: Secondary | ICD-10-CM | POA: Insufficient documentation

## 2015-02-01 DIAGNOSIS — I2510 Atherosclerotic heart disease of native coronary artery without angina pectoris: Secondary | ICD-10-CM | POA: Diagnosis not present

## 2015-02-01 DIAGNOSIS — I6789 Other cerebrovascular disease: Secondary | ICD-10-CM | POA: Diagnosis not present

## 2015-02-01 DIAGNOSIS — I519 Heart disease, unspecified: Secondary | ICD-10-CM

## 2015-02-01 DIAGNOSIS — R51 Headache: Secondary | ICD-10-CM | POA: Diagnosis not present

## 2015-02-01 LAB — COMPREHENSIVE METABOLIC PANEL
ALT: 26 U/L (ref 0–53)
AST: 28 U/L (ref 0–37)
Albumin: 3.9 g/dL (ref 3.5–5.2)
Alkaline Phosphatase: 72 U/L (ref 39–117)
Anion gap: 9 (ref 5–15)
BUN: 14 mg/dL (ref 6–23)
CO2: 21 mmol/L (ref 19–32)
Calcium: 9.3 mg/dL (ref 8.4–10.5)
Chloride: 109 mmol/L (ref 96–112)
Creatinine, Ser: 1.13 mg/dL (ref 0.50–1.35)
GFR calc Af Amer: 75 mL/min — ABNORMAL LOW (ref >90)
GFR calc non Af Amer: 65 mL/min — ABNORMAL LOW (ref >90)
Glucose, Bld: 103 mg/dL — ABNORMAL HIGH (ref 70–99)
Potassium: 4.6 mmol/L (ref 3.5–5.1)
Sodium: 139 mmol/L (ref 135–145)
Total Bilirubin: 1.1 mg/dL (ref 0.3–1.2)
Total Protein: 6.9 g/dL (ref 6.0–8.3)

## 2015-02-01 LAB — CBC
HCT: 46.6 % (ref 39.0–52.0)
Hemoglobin: 16.2 g/dL (ref 13.0–17.0)
MCH: 30.9 pg (ref 26.0–34.0)
MCHC: 34.8 g/dL (ref 30.0–36.0)
MCV: 88.8 fL (ref 78.0–100.0)
Platelets: 147 10*3/uL — ABNORMAL LOW (ref 150–400)
RBC: 5.25 MIL/uL (ref 4.22–5.81)
RDW: 13.4 % (ref 11.5–15.5)
WBC: 6.7 10*3/uL (ref 4.0–10.5)

## 2015-02-01 LAB — DIFFERENTIAL
Basophils Absolute: 0 10*3/uL (ref 0.0–0.1)
Basophils Relative: 0 % (ref 0–1)
Eosinophils Absolute: 0.3 10*3/uL (ref 0.0–0.7)
Eosinophils Relative: 4 % (ref 0–5)
Lymphocytes Relative: 29 % (ref 12–46)
Lymphs Abs: 1.9 10*3/uL (ref 0.7–4.0)
Monocytes Absolute: 0.7 10*3/uL (ref 0.1–1.0)
Monocytes Relative: 10 % (ref 3–12)
Neutro Abs: 3.8 10*3/uL (ref 1.7–7.7)
Neutrophils Relative %: 57 % (ref 43–77)

## 2015-02-01 LAB — I-STAT TROPONIN, ED: Troponin i, poc: 0.02 ng/mL (ref 0.00–0.08)

## 2015-02-01 MED ORDER — PREDNISONE 20 MG PO TABS
60.0000 mg | ORAL_TABLET | Freq: Once | ORAL | Status: AC
  Administered 2015-02-01: 60 mg via ORAL
  Filled 2015-02-01: qty 3

## 2015-02-01 MED ORDER — PREDNISONE 10 MG PO TABS: 60.0000 mg | ORAL_TABLET | Freq: Every day | ORAL | Status: DC

## 2015-02-01 MED ORDER — ACYCLOVIR 400 MG PO TABS: 400.0000 mg | ORAL_TABLET | Freq: Every day | ORAL | Status: DC

## 2015-02-01 NOTE — ED Notes (Addendum)
Pt presents from [facility] via [facility] with c/o left sided facial droop. 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  Pt awoke at 0800 and noticed that the left side of his mouth felt numb and could see a slight droop on that side.  Pt states was able to eat breakfast and drink coffee without issue. Speech is clear no other neuro deficits noted.  Pt also notes he has had a headache over the last two days.

## 2015-02-01 NOTE — ED Notes (Signed)
Phlebotomy at bedside.

## 2015-02-01 NOTE — ED Notes (Signed)
Dr. Campos at bedside   

## 2015-02-01 NOTE — Discharge Instructions (Signed)
Bell's Palsy °Bell's palsy is a condition in which the muscles on one side of the face cannot move (paralysis). This is because the nerves in the face are paralyzed. It is most often thought to be caused by a virus. The virus causes swelling of the nerve that controls movement on one side of the face. The nerve travels through a tight space surrounded by bone. When the nerve swells, it can be compressed by the bone. This results in damage to the protective covering around the nerve. This damage interferes with how the nerve communicates with the muscles of the face. As a result, it can cause weakness or paralysis of the facial muscles.  °Injury (trauma), tumor, and surgery may cause Bell's palsy, but most of the time the cause is unknown. It is a relatively common condition. It starts suddenly (abrupt onset) with the paralysis usually ending within 2 days. Bell's palsy is not dangerous. But because the eye does not close properly, you may need care to keep the eye from getting dry. This can include splinting (to keep the eye shut) or moistening with artificial tears. Bell's palsy very seldom occurs on both sides of the face at the same time. °SYMPTOMS  °· Eyebrow sagging. °· Drooping of the eyelid and corner of the mouth. °· Inability to close one eye. °· Loss of taste on the front of the tongue. °· Sensitivity to loud noises. °TREATMENT  °The treatment is usually non-surgical. If the patient is seen within the first 24 to 48 hours, a short course of steroids may be prescribed, in an attempt to shorten the length of the condition. Antiviral medicines may also be used with the steroids, but it is unclear if they are helpful.  °You will need to protect your eye, if you cannot close it. The cornea (clear covering over your eye) will become dry and can be damaged. Artificial tears can be used to keep your eye moist. Glasses or an eye patch should be worn to protect your eye. °PROGNOSIS  °Recovery is variable, ranging  from days to months. Although the problem usually goes away completely (about 80% of cases resolve), predicting the outcome is impossible. Most people improve within 3 weeks of when the symptoms began. Improvement may continue for 3 to 6 months. A small number of people have moderate to severe weakness that is permanent.  °HOME CARE INSTRUCTIONS  °· If your caregiver prescribed medication to reduce swelling in the nerve, use as directed. Do not stop taking the medication unless directed by your caregiver. °· Use moisturizing eye drops as needed to prevent drying of your eye, as directed by your caregiver. °· Protect your eye, as directed by your caregiver. °· Use facial massage and exercises, as directed by your caregiver. °· Perform your normal activities, and get your normal rest. °SEEK IMMEDIATE MEDICAL CARE IF:  °· There is pain, redness or irritation in the eye. °· You or your child has an oral temperature above 102° F (38.9° C), not controlled by medicine. °MAKE SURE YOU:  °· Understand these instructions. °· Will watch your condition. °· Will get help right away if you are not doing well or get worse. °Document Released: 10/20/2005 Document Revised: 01/12/2012 Document Reviewed: 01/27/2014 °Exit[employer]® Patient Information ©2015 [employer]. This information is not intended to replace advice given to you by your health care provider. Make sure you discuss any questions you have with your health care provider. ° °

## 2015-02-01 NOTE — ED Notes (Signed)
PT comfortable with discharge and follow up instructions. Prescriptions x2. 

## 2015-02-01 NOTE — ED Provider Notes (Signed)
CSN: [id number]     Arrival date & time 02/01/15  1145 History   First MD Initiated Contact with Patient 02/01/15 1207     Chief Complaint  Patient presents with  . Facial Droop      HPI Patient awoke this morning he noticed that he had a left-sided facial droop.  He has abnormal sensation to his left tongue.  He has no tearing out of his left eye.  His vision in his left eye is normal.  He denies weakness of his arms or legs.  Said he noticed this when he was brushing his teeth.  Symptoms are mild in severity.  He was seen in [facility] and sent to the emergency department for evaluation.   Past Medical History  Diagnosis Date  . Hyperlipidemia   . Hypertension   . Stented coronary artery   . Coronary artery disease 01/16/11    nuclear stress test- low risk scan. no ischemia or infarct/scar is seen EF 77%   Past Surgical History  Procedure Laterality Date  . Coronary angioplasty     No family history on file. History  Substance Use Topics  . Smoking status: Former Smoker -- 0.10 packs/day    Types: Cigarettes    Quit date: 02/15/2007  . Smokeless tobacco: Not on file  . Alcohol Use: Not on file    Review of Systems  All other systems reviewed and are negative.     Allergies  Review of patient's allergies indicates no known allergies.  Home Medications   Prior to Admission medications   Medication Sig Start Date End Date Taking? Authorizing Provider  aspirin 325 MG tablet Take 325 mg by mouth daily.    Historical Provider, MD  atorvastatin (LIPITOR) 40 MG tablet TAKE 1 TABLET (40 MG TOTAL) BY MOUTH AT BEDTIME. <PLEASE MAKE APPOINTMENT FOR REFILLS> 01/29/15   Lennette Biharihomas A Kelly, MD  b complex vitamins tablet Take 1 tablet by mouth daily.    Historical Provider, MD  fish oil-omega-3 fatty acids 1000 MG capsule Take 2 g by mouth daily.    Historical Provider, MD  metoprolol succinate (TOPROL-XL) 25 MG 24 hr tablet TAKE 1 TABLET ONCE DAILY. 12/26/13   Lennette Biharihomas A Kelly,  MD  Multiple Vitamin (MULTIVITAMIN WITH MINERALS) TABS tablet Take 1 tablet by mouth daily.    Historical Provider, MD   BP 151/75 mmHg  Pulse 56  Temp(Src) 97.9 F (36.6 C) (Oral)  Resp 12  SpO2 99% Physical Exam  Constitutional: He is oriented to person, place, and time. He appears well-developed and well-nourished.  HENT:  Head: Atraumatic.  Eyes: EOM are normal.  Neck: Normal range of motion.  Cardiovascular: Normal rate, regular rhythm, normal heart sounds and intact distal pulses.   Pulmonary/Chest: Effort normal and breath sounds normal. No respiratory distress.  Abdominal: Soft. He exhibits no distension. There is no tenderness.  Musculoskeletal: Normal range of motion.  Neurological: He is alert and oriented to person, place, and time.  Left-sided facial droop.  Partial weakness of the left forehead. No weakness of arms or legs.   Skin: Skin is warm and dry.  Psychiatric: He has a normal mood and affect. Judgment normal.  Nursing note and vitals reviewed.   ED Course  Procedures (including critical care time) Labs Review Labs Reviewed  CBC - Abnormal; Notable for the following:    Platelets 147 (*)    All other components within normal limits  COMPREHENSIVE METABOLIC PANEL - Abnormal; Notable for  the following:    Glucose, Bld 103 (*)    GFR calc non Af Amer 65 (*)    GFR calc Af Amer 75 (*)    All other components within normal limits  DIFFERENTIAL  Rosezena Sensor, ED    Imaging Review Mr Brain Wo Contrast  02/01/2015   CLINICAL DATA:  Left-sided facial droop, acute onset upon waking.  EXAM: MRI HEAD WITHOUT CONTRAST  TECHNIQUE: Multiplanar, multiecho pulse sequences of the brain and surrounding structures were obtained without intravenous contrast.  COMPARISON:  None.  FINDINGS: Diffusion imaging does not show any acute or subacute infarction. The brainstem and cerebellum are normal. The cerebral hemispheres show a few small foci of T2 and FLAIR signal in the  white matter consistent with minimal small vessel change, less than often seen in healthy individuals of this age. No cortical or large vessel territory infarction. No mass lesion, hemorrhage, hydrocephalus or extra-axial collection. No fluid in the middle ears or mastoids. There is mucosal inflammation of the left maxillary sinus. No skull or skullbase lesion. Major vessels at the base of the brain show flow.  IMPRESSION: No acute stroke.  Minimal chronic small vessel change of the white matter, less than often seen in healthy individuals of this age.  Some mucosal inflammation of the left maxillary sinus.   Electronically Signed   By: Paulina Fusi M.D.   On: 02/01/2015 15:18     EKG Interpretation   Date/Time:  Thursday February 01 2015 11:54:01 EDT Ventricular Rate:  59 PR Interval:  189 QRS Duration: 90 QT Interval:  415 QTC Calculation: 411 R Axis:   57 Text Interpretation:  Sinus rhythm No old tracing to compare Confirmed by  Kyrin Garn  MD, Caryn Bee ([id number]) on 02/01/2015 12:16:14 PM      MDM   Final diagnoses:  None    The patient appears to have some weakness of his left forehead although it is not conclusive.  I suspect this is a left-sided Bell's palsy.  Patient will undergo MRI imaging at this time.  No indication for TPA at this point.    Azalia Bilis, MD 02/01/15 [phone]

## 2015-02-01 NOTE — ED Notes (Signed)
Patient transported to MRI 

## 2015-02-01 NOTE — Progress Notes (Signed)
Chief Complaint:  Chief Complaint  Patient presents with  . Stroke Symptoms     awoke at 8am noticed facial numbness, weakness  . Headache    x 2 days dull aleive helped    HPI: Patrick Price is a 69 y.o. male who is here for  acute onset of left-sided facial numbness, weakness this morning at 8:00 when he was brushing his teeth. He realized it was weak when he was trying to get some fluids since his mouth and also noticed that he was spitting out the water without much mouth control and the water was leaking out the left side of his mouth. He also has had a constant dull headache for the last 2 days. He has tried Aleve with normal relief. He has a history of hypertension, coronary artery disease status post 1 stent about 15 years ago, also hyperlipidemia. He normally is able to whistle but cannot do so. He denies any chest pain, shortness of breath or palpitations.  He denies confusion or vision changes, there was numbness and tingling in his face, denies nausea, vomiting, gait changes.   His cardiologist is Dr. Tresa Endo   Normal blood pressure is 110s  over 80s. Today his blood pressure is 160/84   Past Medical History  Diagnosis Date  . Hyperlipidemia   . Hypertension   . Stented coronary artery   . Coronary artery disease 01/16/11    nuclear stress test- low risk scan. no ischemia or infarct/scar is seen EF 77%   Past Surgical History  Procedure Laterality Date  . Coronary angioplasty     History   Social History  . Marital Status: Married    Spouse Name: N/A  . Number of Children: N/A  . Years of Education: N/A   Social History Main Topics  . Smoking status: Former Smoker -- 0.10 packs/day    Types: Cigarettes    Quit date: 02/15/2007  . Smokeless tobacco: Not on file  . Alcohol Use: Not on file  . Drug Use: Not on file  . Sexual Activity: Not on file   Other Topics Concern  . None   Social History Narrative   No family history on file. No Known  Allergies Prior to Admission medications   Medication Sig Start Date End Date Taking? Authorizing Provider  aspirin 325 MG tablet Take 325 mg by mouth daily.   Yes Historical Provider, MD  atorvastatin (LIPITOR) 40 MG tablet TAKE 1 TABLET (40 MG TOTAL) BY MOUTH AT BEDTIME. <PLEASE MAKE APPOINTMENT FOR REFILLS> 01/29/15  Yes Lennette Bihari, MD  b complex vitamins tablet Take 1 tablet by mouth daily.   Yes Historical Provider, MD  fish oil-omega-3 fatty acids 1000 MG capsule Take 2 g by mouth daily.   Yes Historical Provider, MD  metoprolol succinate (TOPROL-XL) 25 MG 24 hr tablet TAKE 1 TABLET ONCE DAILY. 12/26/13  Yes Lennette Bihari, MD  Multiple Vitamin (MULTIVITAMIN WITH MINERALS) TABS tablet Take 1 tablet by mouth daily.   Yes Historical Provider, MD     ROS: The patient denies fevers, chills, night sweats, unintentional weight loss, chest pain, palpitations, wheezing, dyspnea on exertion, nausea, vomiting, abdominal pain, dysuria, hematuria, melena  All other systems have been reviewed and were otherwise negative with the exception of those mentioned in the HPI and as above.    PHYSICAL EXAM: Filed Vitals:   02/01/15 1102  BP: 160/84  Pulse: 65  Temp: 98.3 F (36.8 C)  Resp:  16   Filed Vitals:   02/01/15 1102  Height: 5\' 9"  (1.753 m)  Weight: 201 lb (91.173 kg)   Body mass index is 29.67 kg/(m^2).  General: Alert, no acute distress HEENT:  Normocephalic, atraumatic, oropharynx patent. EOMI, PERRLA. Neg for nystagmus Cardiovascular:  Regular rate and rhythm, no rubs murmurs or gallops.  No Carotid bruits, radial pulse intact. No pedal edema.  Respiratory: Clear to auscultation bilaterally.  No wheezes, rales, or rhonchi.  No cyanosis, no use of accessory musculature GI: No organomegaly, abdomen is soft and non-tender, positive bowel sounds.  No masses. Skin: No rashes. Neurologic: Facial musculature  slightly asymmetric, minimal left sided droop. Patient can't whistle. He  states that he normally can.   5/5 UE and LE strength, brisk 2/2 DTRs in LE Psychiatric: Patient is appropriate throughout our interaction. Lymphatic: No cervical lymphadenopathy Musculoskeletal: Gait intact.     LABS: Results for orders placed or performed in visit on 10/01/13  Uric acid  Result Value Ref Range   Uric Acid, Serum 6.2 4.0 - 7.8 mg/dL  POCT CBC  Result Value Ref Range   WBC 7.1 4.6 - 10.2 K/uL   Lymph, poc 2.1 0.6 - 3.4   POC LYMPH PERCENT 29.4 10 - 50 %L   MID (cbc) 0.4 0 - 0.9   POC MID % 6.2 0 - 12 %M   POC Granulocyte 4.6 2 - 6.9   Granulocyte percent 64.4 37 - 80 %G   RBC 4.33 (A) 4.69 - 6.13 M/uL   Hemoglobin 13.3 (A) 14.1 - 18.1 g/dL   HCT, POC 60.441.9 (A) 54.043.5 - 53.7 %   MCV 96.7 80 - 97 fL   MCH, POC 30.7 27 - 31.2 pg   MCHC 31.7 (A) 31.8 - 35.4 g/dL   RDW, POC 98.114.1 %   Platelet Count, POC 128 (A) 142 - 424 K/uL   MPV 10.1 0 - 99.8 fL     EKG/XRAY:   [facility]   ASSESSMENT/PLAN: Encounter Diagnoses  Name Primary?  . Facial droop Yes  . Essential hypertension   . Hyperlipidemia   . Cardiac disease   . Acute intractable headache, unspecified headache type    69 year old male with a past medical history of coronary disease status post 1 stent 15 years ago, hypertension, hyperlipidemia who presents with left facial droop this morning at 8:00, HA and abnormal higher BP reading for him. He has not taken ASA today.  [facility].   Gross sideeffects, risk and benefits, and alternatives of medications d/w patient. Patient is aware that all medications have potential sideeffects and we are unable to predict every sideeffect or drug-drug interaction that may occur.  LE, THAO PHUONG, DO 02/01/2015 11:21 AM

## 2015-02-08 ENCOUNTER — Ambulatory Visit (INDEPENDENT_AMBULATORY_CARE_PROVIDER_SITE_OTHER): Payer: Medicare Other | Admitting: Family Medicine

## 2015-02-08 VITALS — BP 138/78 | HR 67 | Temp 97.6°F | Resp 16 | Ht 69.0 in | Wt 198.0 lb

## 2015-02-08 DIAGNOSIS — R519 Headache, unspecified: Secondary | ICD-10-CM

## 2015-02-08 DIAGNOSIS — G510 Bell's palsy: Secondary | ICD-10-CM | POA: Diagnosis not present

## 2015-02-08 DIAGNOSIS — R350 Frequency of micturition: Secondary | ICD-10-CM | POA: Diagnosis not present

## 2015-02-08 DIAGNOSIS — R51 Headache: Secondary | ICD-10-CM

## 2015-02-08 LAB — POCT URINALYSIS DIPSTICK
Bilirubin, UA: NEGATIVE
Blood, UA: NEGATIVE
Glucose, UA: NEGATIVE
Ketones, UA: NEGATIVE
Leukocytes, UA: NEGATIVE
Nitrite, UA: NEGATIVE
Protein, UA: NEGATIVE
Spec Grav, UA: 1.02
Urobilinogen, UA: 0.2
pH, UA: 6.5

## 2015-02-08 LAB — POCT CBC
Granulocyte percent: 79.6 %G (ref 37–80)
HCT, POC: 51.1 % (ref 43.5–53.7)
Hemoglobin: 17.1 g/dL (ref 14.1–18.1)
Lymph, poc: 2.3 (ref 0.6–3.4)
MCH, POC: 29.4 pg (ref 27–31.2)
MCHC: 33.4 g/dL (ref 31.8–35.4)
MCV: 88 fL (ref 80–97)
MID (cbc): 0.4 (ref 0–0.9)
MPV: 7 fL (ref 0–99.8)
POC Granulocyte: 10.4 — AB (ref 2–6.9)
POC LYMPH PERCENT: 17.6 %L (ref 10–50)
POC MID %: 2.8 %M (ref 0–12)
Platelet Count, POC: 232 10*3/uL (ref 142–424)
RBC: 5.81 M/uL (ref 4.69–6.13)
RDW, POC: 14.1 %
WBC: 13.1 10*3/uL — AB (ref 4.6–10.2)

## 2015-02-08 LAB — POCT UA - MICROSCOPIC ONLY
Bacteria, U Microscopic: NEGATIVE
Casts, Ur, LPF, POC: NEGATIVE
Crystals, Ur, HPF, POC: NEGATIVE
Mucus, UA: NEGATIVE
RBC, urine, microscopic: NEGATIVE
Yeast, UA: NEGATIVE

## 2015-02-08 LAB — POCT GLYCOSYLATED HEMOGLOBIN (HGB A1C): Hemoglobin A1C: 5.2

## 2015-02-08 LAB — GLUCOSE, POCT (MANUAL RESULT ENTRY): POC Glucose: 85 mg/dl (ref 70–99)

## 2015-02-08 MED ORDER — KETOROLAC TROMETHAMINE 60 MG/2ML IM SOLN
60.0000 mg | Freq: Once | INTRAMUSCULAR | Status: AC
  Administered 2015-02-08: 60 mg via INTRAMUSCULAR

## 2015-02-08 MED ORDER — TRAMADOL HCL 50 MG PO TABS: 50.0000 mg | ORAL_TABLET | Freq: Four times a day (QID) | ORAL | Status: DC | PRN

## 2015-02-08 NOTE — Progress Notes (Signed)
Subjective: 69 year old man who is in here last week with Bell's palsy. He was sent to the emergency room where the diagnosis was made an MRI was normal. He was placed on acyclovir and prednisone. The acyclovir made him feel bad and he quit taking it. He just finished his prednisone. Last night he had urinary frequency every hour or so. He had a bad headache which still persists. Is not able to sleep. No nausea or vomiting. The neurologic function in his face started returning pretty well today.  He does not have a history of having a lot of headaches like this. It started right before the Bell's palsy developed.  Objective: TMs normal. Eyes PERRLA. Fundi benign. Throat clear. Neck supple without nodes or thyromegaly. No carotid bruits. Only minimal left facial droop and weakness of late eyelids on the left. His cranial nerves II-12 otherwise grossly intact. Motor strength symmetrical. Romberg's negative. Alert and oriented. No CVA tenderness. Abdomen soft.  Assessment: Urinary frequency Headache Bell's palsy  Plan: The frequency may well be from the prednisone. I imagine just from us to sleep also. Check labs. Give him a shot of Toradol for headache. He wants something now.  Results for orders placed or performed in visit on 02/08/15  POCT CBC  Result Value Ref Range   WBC 13.1 (A) 4.6 - 10.2 K/uL   Lymph, poc 2.3 0.6 - 3.4   POC LYMPH PERCENT 17.6 10 - 50 %L   MID (cbc) 0.4 0 - 0.9   POC MID % 2.8 0 - 12 %M   POC Granulocyte 10.4 (A) 2 - 6.9   Granulocyte percent 79.6 37 - 80 %G   RBC 5.81 4.69 - 6.13 M/uL   Hemoglobin 17.1 14.1 - 18.1 g/dL   HCT, POC 40.951.1 81.143.5 - 53.7 %   MCV 88.0 80 - 97 fL   MCH, POC 29.4 27 - 31.2 pg   MCHC 33.4 31.8 - 35.4 g/dL   RDW, POC 91.414.1 %   Platelet Count, POC 232 142 - 424 K/uL   MPV 7.0 0 - 99.8 fL  POCT glucose (manual entry)  Result Value Ref Range   POC Glucose 85 70 - 99 mg/dl  POCT glycosylated hemoglobin (Hb A1C)  Result Value Ref Range   Hemoglobin A1C 5.2   POCT UA - Microscopic Only  Result Value Ref Range   WBC, Ur, HPF, POC 0-1    RBC, urine, microscopic neg    Bacteria, U Microscopic neg    Mucus, UA neg    Epithelial cells, urine per micros 0-1    Crystals, Ur, HPF, POC neg    Casts, Ur, LPF, POC neg    Yeast, UA neg   POCT urinalysis dipstick  Result Value Ref Range   Color, UA yellow    Clarity, UA clear    Glucose, UA neg    Bilirubin, UA neg    Ketones, UA neg    Spec Grav, UA 1.020    Blood, UA neg    pH, UA 6.5    Protein, UA neg    Urobilinogen, UA 0.2    Nitrite, UA neg    Leukocytes, UA Negative     WBC is probably elevated from the prednisone.  Symptomatic treatment for the headache which is probably a postviral problem. If symptoms persist he is to return

## 2015-02-08 NOTE — Patient Instructions (Signed)
Take tramadol 1 every 6 hours as needed for headache. It works very well if taken along with one or 2 acetaminophen (Tylenol) for worse pain.  If the urinary symptoms continue to persist will need to return for a recheck  If headache should get worse and not be doing better return or go back to the emergency room  No further follow-up is needed for the Bell's palsy assuming that it is continuing to improve. However if you have further concerns or symptoms are persisting after a month or 6 weeks please return.

## 2015-02-14 DIAGNOSIS — R350 Frequency of micturition: Secondary | ICD-10-CM | POA: Diagnosis not present

## 2015-02-14 DIAGNOSIS — N401 Enlarged prostate with lower urinary tract symptoms: Secondary | ICD-10-CM | POA: Diagnosis not present

## 2015-02-14 DIAGNOSIS — R972 Elevated prostate specific antigen [PSA]: Secondary | ICD-10-CM | POA: Diagnosis not present

## 2015-02-25 ENCOUNTER — Ambulatory Visit (INDEPENDENT_AMBULATORY_CARE_PROVIDER_SITE_OTHER): Payer: Medicare Other

## 2015-02-25 ENCOUNTER — Ambulatory Visit (INDEPENDENT_AMBULATORY_CARE_PROVIDER_SITE_OTHER): Payer: Medicare Other | Admitting: Internal Medicine

## 2015-02-25 VITALS — BP 116/68 | HR 58 | Temp 98.1°F | Resp 16 | Ht 69.0 in | Wt 193.0 lb

## 2015-02-25 DIAGNOSIS — G609 Hereditary and idiopathic neuropathy, unspecified: Secondary | ICD-10-CM

## 2015-02-25 DIAGNOSIS — E785 Hyperlipidemia, unspecified: Secondary | ICD-10-CM

## 2015-02-25 DIAGNOSIS — T82897D Other specified complication of cardiac prosthetic devices, implants and grafts, subsequent encounter: Secondary | ICD-10-CM

## 2015-02-25 DIAGNOSIS — Z125 Encounter for screening for malignant neoplasm of prostate: Secondary | ICD-10-CM | POA: Diagnosis not present

## 2015-02-25 DIAGNOSIS — R634 Abnormal weight loss: Secondary | ICD-10-CM | POA: Diagnosis not present

## 2015-02-25 LAB — TSH: TSH: 0.939 u[IU]/mL (ref 0.350–4.500)

## 2015-02-25 LAB — POCT SEDIMENTATION RATE: POCT SED RATE: 16 mm/hr (ref 0–22)

## 2015-02-25 LAB — VITAMIN B12: Vitamin B-12: 493 pg/mL (ref 211–911)

## 2015-02-25 NOTE — Progress Notes (Signed)
Subjective:    Patient ID: Patrick Price, male    DOB: October 07, 1946, 69 y.o.   MRN: [id number]  HPI See past recent hx in record. CO numbness hands, feet, arms trunk. Skin is tingly and numb and comes and goes. Uses vap nicotine. Reviwed all past labs, ekg, MRI brain all ok. Has intermittent numbness, symmetric, lips, fingers, toes. Occ goes up arms and includes trunk. Completely resoves while asleep and is 100% well when he wakes up.  Following scribed by Patrick Cater RN/RT with Dr. Perrin Price present in the room. Pt states it has been 16 years since his stent procedure.   Pt has had colonoscopy 5 years ago with polyp removal and pt admits it is time for another colonoscopy.  Review of Systems    Objective:   Physical Exam  Constitutional: He is oriented to person, place, and time. He appears well-developed and well-nourished. No distress.  HENT:  Head: Normocephalic.  Mouth/Throat: Oropharynx is clear and moist.  Eyes: Conjunctivae and EOM are normal. Pupils are equal, round, and reactive to light. No scleral icterus.  Neck: Normal range of motion. Neck supple. No thyromegaly present.  Cardiovascular: Normal rate, regular rhythm, normal heart sounds and intact distal pulses.  Exam reveals no gallop.   No murmur heard. Pulmonary/Chest: Effort normal and breath sounds normal.  Musculoskeletal: Normal range of motion.  Lymphadenopathy:    He has no cervical adenopathy.  Neurological: He is alert and oriented to person, place, and time. He has normal strength and normal reflexes. He displays normal reflexes. No cranial nerve deficit or sensory deficit. He exhibits normal muscle tone. He displays a negative Romberg sign. Coordination and gait normal. He displays no Babinski's sign on the right side. He displays no Babinski's sign on the left side.  Reflex Scores:      Patellar reflexes are 2+ on the right side and 2+ on the left side.      Achilles reflexes are 2+ on the right side and 2+ on  the left side. Balance unsteady at first but able to do ten seconds each foot  No drift  Skin: Skin is intact. He is not diaphoretic. There is erythema.   Heart steady and slow. Has been 1 year since evaluation by cardiologist. Denies any black out spells. Strength in BUE good.  BUE aches today per pt. Change in sensation to BLE and BUE-hands.  [facility] reading (PRIMARY) by  Dr.Tennessee Price normal cxr  Results for orders placed or performed in visit on 02/08/15  POCT CBC  Result Value Ref Range   WBC 13.1 (A) 4.6 - 10.2 K/uL   Lymph, poc 2.3 0.6 - 3.4   POC LYMPH PERCENT 17.6 10 - 50 %L   MID (cbc) 0.4 0 - 0.9   POC MID % 2.8 0 - 12 %M   POC Granulocyte 10.4 (A) 2 - 6.9   Granulocyte percent 79.6 37 - 80 %G   RBC 5.81 4.69 - 6.13 M/uL   Hemoglobin 17.1 14.1 - 18.1 g/dL   HCT, POC 91.4 78.2 - 53.7 %   MCV 88.0 80 - 97 fL   MCH, POC 29.4 27 - 31.2 pg   MCHC 33.4 31.8 - 35.4 g/dL   RDW, POC 95.6 %   Platelet Count, POC 232 142 - 424 K/uL   MPV 7.0 0 - 99.8 fL  POCT glucose (manual entry)  Result Value Ref Range   POC Glucose 85 70 - 99 mg/dl  POCT glycosylated  hemoglobin (Hb A1C)  Result Value Ref Range   Hemoglobin A1C 5.2   POCT UA - Microscopic Only  Result Value Ref Range   WBC, Ur, HPF, POC 0-1    RBC, urine, microscopic neg    Bacteria, U Microscopic neg    Mucus, UA neg    Epithelial cells, urine per micros 0-1    Crystals, Ur, HPF, POC neg    Casts, Ur, LPF, POC neg    Yeast, UA neg   POCT urinalysis dipstick  Result Value Ref Range   Color, UA yellow    Clarity, UA clear    Glucose, UA neg    Bilirubin, UA neg    Ketones, UA neg    Spec Grav, UA 1.020    Blood, UA neg    pH, UA 6.5    Protein, UA neg    Urobilinogen, UA 0.2    Nitrite, UA neg    Leukocytes, UA Negative           Assessment & Plan:  Bilateral symmetric subjective loss of sensation, intermittent Vape nicotine uses  Screen B12, TSH, SPEP,Sed Rate  Start B complex vitamins Keep  diary of sxs Stop Vape nicotine/?formaldehyde RTC or see FP in 2 weeks

## 2015-02-25 NOTE — Patient Instructions (Addendum)
Peripheral Neuropathy Peripheral neuropathy is a type of nerve damage. It affects nerves that carry signals between the spinal cord and other parts of the body. These are called peripheral nerves. With peripheral neuropathy, one nerve or a group of nerves may be damaged.  CAUSES  Many things can damage peripheral nerves. For some people with peripheral neuropathy, the cause is unknown. Some causes include:  Diabetes. This is the most common cause of peripheral neuropathy.  Injury to a nerve.  Pressure or stress on a nerve that lasts a long time.  Too little vitamin B. Alcoholism can lead to this.  Infections.  Autoimmune diseases, such as multiple sclerosis and systemic lupus erythematosus.  Inherited nerve diseases.  Some medicines, such as cancer drugs.  Toxic substances, such as lead and mercury.  Too little blood flowing to the legs.  Kidney disease.  Thyroid disease. SIGNS AND SYMPTOMS  Different people have different symptoms. The symptoms you have will depend on which of your nerves is damaged. Common symptoms include:  Loss of feeling (numbness) in the feet and hands.  Tingling in the feet and hands.  Pain that burns.  Very sensitive skin.  Weakness.  Not being able to move a part of the body (paralysis).  Muscle twitching.  Clumsiness or poor coordination.  Loss of balance.  Not being able to control your bladder.  Feeling dizzy.  Sexual problems. DIAGNOSIS  Peripheral neuropathy is a symptom, not a disease. Finding the cause of peripheral neuropathy can be hard. To figure that out, your health care provider will take a medical history and do a physical exam. A neurological exam will also be done. This involves checking things affected by your brain, spinal cord, and nerves (nervous system). For example, your health care provider will check your reflexes, how you move, and what you can feel.  Other types of tests may also be ordered, such as:  Blood  tests.  A test of the fluid in your spinal cord.  Imaging tests, such as CT scans or an MRI.  Electromyography (EMG). This test checks the nerves that control muscles.  Nerve conduction velocity tests. These tests check how fast messages pass through your nerves.  Nerve biopsy. A small piece of nerve is removed. It is then checked under a microscope. TREATMENT   Medicine is often used to treat peripheral neuropathy. Medicines may include:  Pain-relieving medicines. Prescription or over-the-counter medicine may be suggested.  Antiseizure medicine. This may be used for pain.  Antidepressants. These also may help ease pain from neuropathy.  Lidocaine. This is a numbing medicine. You might wear a patch or be given a shot.  Mexiletine. This medicine is typically used to help control irregular heart rhythms.  Surgery. Surgery may be needed to relieve pressure on a nerve or to destroy a nerve that is causing pain.  Physical therapy to help movement.  Assistive devices to help movement. HOME CARE INSTRUCTIONS   Only take over-the-counter or prescription medicines as directed by your health care provider. Follow the instructions carefully for any given medicines. Do not take any other medicines without first getting approval from your health care provider.  If you have diabetes, work closely with your health care provider to keep your blood sugar under control.  If you have numbness in your feet:  Check every day for signs of injury or infection. Watch for redness, warmth, and swelling.  Wear padded socks and comfortable shoes. These help protect your feet.  Do not do   things that put pressure on your damaged nerve.  Do not smoke. Smoking keeps blood from getting to damaged nerves.  Avoid or limit alcohol. Too much alcohol can cause a lack of B vitamins. These vitamins are needed for healthy nerves.  Develop a good support system. Coping with peripheral neuropathy can be  stressful. Talk to a mental health specialist or join a support group if you are struggling.  Follow up with your health care provider as directed. SEEK MEDICAL CARE IF:   You have new signs or symptoms of peripheral neuropathy.  You are struggling emotionally from dealing with peripheral neuropathy.  You have a fever. SEEK IMMEDIATE MEDICAL CARE IF:   You have an injury or infection that is not healing.  You feel very dizzy or begin vomiting.  You have chest pain.  You have trouble breathing. Document Released: 10/10/2002 Document Revised: 07/02/2011 Document Reviewed: 06/27/2013 [facility]ExitCare Patient Information 2015 [place], [place]. This information is not intended to replace advice given to you by your health care provider. Make sure you discuss any questions you have with your health care provider. Hyperventilation Hyperventilation is breathing that is deeper and more rapid than normal. It is usually associated with panic and anxiety. Hyperventilation can make you feel breathless. It is sometimes called overbreathing. Breathing out too much causes a decrease in the amount of carbon dioxide gas in the blood. This leads to tingling and numbness in the hands, feet, and around the mouth. If this continues, your fingers, hands, and toes may begin to spasm. Hyperventilation usually lasts 20-30 minutes and can be associated with other symptoms of panic and anxiety, including:   Chest pains or tightness.  A pounding or irregular, racing heartbeat (palpitations).  Dizziness.  Lightheadedness.  Dry mouth.  Weakness.  Confusion.  Sleep disturbance. CAUSES  Sudden onset (acute) hyperventilation is usually triggered by acute stress, anxiety, or emotional upset. Long-term (chronic) and recurring hyperventilation can occur with chronic lung problems, such emphysema or asthma. Other causes include:   Nervousness.  Stress.  Stimulant, drug, or alcohol use.  Lung disease.  Infections,  such as pneumonia.  Heart problems.  Severe pain.  Waking from a bad dream.  Pregnancy.  Bleeding. HOME CARE INSTRUCTIONS  Learn and use breathing exercises that help you breathe from your diaphragm and abdomen.  Practice relaxation techniques to reduce stress, such as visualization, meditation, and muscle release.  During an attack, try breathing into a paper bag. This changes the carbon dioxide level and slows down breathing. SEEK IMMEDIATE MEDICAL CARE IF:  Your hyperventilation continues or gets worse. MAKE SURE YOU:  Understand these instructions.  Will watch your condition.  Will get help right away if you are not doing well or get worse. Document Released: 10/17/2000 Document Revised: 04/20/2012 Document Reviewed: 01/29/2012 [facility]ExitCare Patient Information 2015 [place], [place]. This information is not intended to replace advice given to you by your health care provider. Make sure you discuss any questions you have with your health care provider. B-Complex Vitamin with Vitamin C formulations What is this medicine? B-COMPLEX VITAMIN with VITAMIN C (B-KOM pleks VAHY tuh min with VAHY tuh min C) is a multivitamin. It is mostly used to help provide good nutrition to people who need extra intake of these vitamins due to stress, renal disease, or other medical conditions. This medicine may be used for other purposes; ask your health care provider or pharmacist if you have questions. COMMON BRAND NAME(S): B-Plex, Cholinoid, DexFol, Diatx, Kerr-McGeeFolbee Plus, Rena-Vite, Strovite, Surbex,  [place]bec, Vitaplex What should I tell my health care provider before I take this medicine? They need to know if you have any of these conditions: -any chronic health condition -an unusual or allergic reaction to vitamins, other medicines, foods, dyes, or preservatives -pregnant or trying to get pregnant -breast-feeding How should I use this medicine? Take by mouth with a glass of water. May take with  food. Follow the directions on the prescription label. This vitamin is usually given once a day. Do not take your medicine more often than directed. Talk to your pediatrician regarding the use of this medicine in children. Special care may be needed. Overdosage: If you think you have taken too much of this medicine contact a poison control center or emergency room at once. NOTE: This medicine is only for you. Do not share this medicine with others. What if I miss a dose? If you miss a dose, take it as soon as you can. If it is almost time for your next dose, take only that dose. Do not take double or extra doses. What may interact with this medicine? -levodopa This list may not describe all possible interactions. Give your health care provider a list of all the medicines, herbs, non-prescription drugs, or dietary supplements you use. Also tell them if you smoke, drink alcohol, or use illegal drugs. Some items may interact with your medicine. What should I watch for while using this medicine? See your health care professional for regular checks on your progress. Remember that vitamin supplements do not replace the need for good nutrition from a balanced diet. What side effects may I notice from receiving this medicine? Side effects that you should report to your doctor or health care professional as soon as possible: -allergic reaction such as skin rash or difficulty breathing -vomiting Side effects that usually do not require medical attention (report to your doctor or health care professional if they continue or are bothersome): -nausea -stomach upset This list may not describe all possible side effects. Call your doctor for medical advice about side effects. You may report side effects to FDA at 1-800-FDA-1088. Where should I keep my medicine? Keep out of the reach of children. Most vitamins should be stored at controlled room temperature. Check your specific product directions. Protect from  heat and moisture. Throw away any unused medicine after the expiration date. NOTE: This sheet is a summary. It may not cover all possible information. If you have questions about this medicine, talk to your doctor, pharmacist, or health care provider.  2015, [employer] Standard. (2008-01-20 14:17:42)

## 2015-02-26 ENCOUNTER — Telehealth: Payer: Self-pay

## 2015-02-26 LAB — PSA: PSA: 0.24 ng/mL (ref <=4.00)

## 2015-02-26 NOTE — Telephone Encounter (Signed)
Mr. Patrick Price called regarding a MyChart message requesting that he schedule his annual Medicare Wellness visit.  Mr. Patrick Price states he is scheduling an appointment for a colonoscopy and will call to schedule his wellness visit with us as soon as his colonoscopy has been completed.

## 2015-02-27 ENCOUNTER — Encounter: Payer: Self-pay | Admitting: Family Medicine

## 2015-02-27 ENCOUNTER — Other Ambulatory Visit: Payer: Self-pay | Admitting: Cardiovascular Disease

## 2015-02-27 NOTE — Telephone Encounter (Signed)
Rx refill sent to patient pharmacy  Patient has appointment 04/25/15 with TK

## 2015-02-28 LAB — PROTEIN ELECTROPHORESIS, SERUM, WITH REFLEX
Albumin ELP: 4.4 g/dL (ref 3.8–4.8)
Alpha-1-Globulin: 0.2 g/dL (ref 0.2–0.3)
Alpha-2-Globulin: 0.9 g/dL (ref 0.5–0.9)
Beta 2: 0.4 g/dL (ref 0.2–0.5)
Beta Globulin: 0.4 g/dL (ref 0.4–0.6)
Gamma Globulin: 1 g/dL (ref 0.8–1.7)
Total Protein, Serum Electrophoresis: 7.3 g/dL (ref 6.1–8.1)

## 2015-02-28 LAB — IGG, IGA, IGM
IgA: 236 mg/dL (ref 68–379)
IgG (Immunoglobin G), Serum: 1100 mg/dL (ref 650–1600)
IgM, Serum: 111 mg/dL (ref 41–251)

## 2015-02-28 LAB — IFE INTERPRETATION

## 2015-03-04 DIAGNOSIS — G629 Polyneuropathy, unspecified: Secondary | ICD-10-CM

## 2015-03-04 HISTORY — DX: Polyneuropathy, unspecified: G62.9

## 2015-03-05 ENCOUNTER — Other Ambulatory Visit: Payer: Self-pay

## 2015-03-05 MED ORDER — METOPROLOL SUCCINATE ER 25 MG PO TB24: 25.0000 mg | ORAL_TABLET | Freq: Every day | ORAL | Status: DC

## 2015-03-05 NOTE — Telephone Encounter (Signed)
Rx(s) sent to pharmacy electronically.  

## 2015-03-07 ENCOUNTER — Ambulatory Visit (INDEPENDENT_AMBULATORY_CARE_PROVIDER_SITE_OTHER): Payer: Medicare Other | Admitting: Internal Medicine

## 2015-03-07 VITALS — BP 100/64 | HR 63 | Temp 97.8°F | Resp 16 | Ht 69.5 in | Wt 190.2 lb

## 2015-03-07 DIAGNOSIS — D72829 Elevated white blood cell count, unspecified: Secondary | ICD-10-CM | POA: Diagnosis not present

## 2015-03-07 DIAGNOSIS — M79643 Pain in unspecified hand: Secondary | ICD-10-CM | POA: Diagnosis not present

## 2015-03-07 DIAGNOSIS — F329 Major depressive disorder, single episode, unspecified: Secondary | ICD-10-CM | POA: Diagnosis not present

## 2015-03-07 DIAGNOSIS — M79672 Pain in left foot: Secondary | ICD-10-CM

## 2015-03-07 DIAGNOSIS — M79671 Pain in right foot: Secondary | ICD-10-CM

## 2015-03-07 DIAGNOSIS — R5383 Other fatigue: Secondary | ICD-10-CM

## 2015-03-07 DIAGNOSIS — Z658 Other specified problems related to psychosocial circumstances: Secondary | ICD-10-CM | POA: Diagnosis not present

## 2015-03-07 DIAGNOSIS — F439 Reaction to severe stress, unspecified: Secondary | ICD-10-CM

## 2015-03-07 DIAGNOSIS — R202 Paresthesia of skin: Secondary | ICD-10-CM

## 2015-03-07 DIAGNOSIS — F32A Depression, unspecified: Secondary | ICD-10-CM

## 2015-03-07 LAB — POCT CBC
Granulocyte percent: 66.5 %G (ref 37–80)
HCT, POC: 47.4 % (ref 43.5–53.7)
Hemoglobin: 16 g/dL (ref 14.1–18.1)
Lymph, poc: 2.1 (ref 0.6–3.4)
MCH, POC: 30.3 pg (ref 27–31.2)
MCHC: 33.8 g/dL (ref 31.8–35.4)
MCV: 89.6 fL (ref 80–97)
MID (cbc): 0.6 (ref 0–0.9)
MPV: 8.1 fL (ref 0–99.8)
POC Granulocyte: 5.3 (ref 2–6.9)
POC LYMPH PERCENT: 26.2 %L (ref 10–50)
POC MID %: 7.3 %M (ref 0–12)
Platelet Count, POC: 188 10*3/uL (ref 142–424)
RBC: 5.29 M/uL (ref 4.69–6.13)
RDW, POC: 13.9 %
WBC: 8 10*3/uL (ref 4.6–10.2)

## 2015-03-07 LAB — C-REACTIVE PROTEIN: CRP: <0.5 mg/dL (ref <0.60)

## 2015-03-07 LAB — RHEUMATOID FACTOR: Rhuematoid fact SerPl-aCnc: <10 IU/mL (ref <=14)

## 2015-03-07 MED ORDER — CLONAZEPAM 0.5 MG PO TABS: 0.5000 mg | ORAL_TABLET | Freq: Two times a day (BID) | ORAL | Status: DC | PRN

## 2015-03-07 NOTE — Progress Notes (Signed)
   Subjective:   I Patrick Price, CMA am scribing for Dr. Thayer Ohmhris Faige Price   Patient ID: Patrick Price, male    DOB: 12/04/1945, 69 y.o.   MRN: [id number]  HPI 69 year old male presents to clinic today with complaints of fatigue, and numbness in his limbs. Reports that this has been going on since being seen for bells palsy. States that the tip of his tongue is still not normal and feels like everything taste rancid. Also reports having some dizziness and nausea upon standing . Reports some depressed feelings due to not knowing what was going on. States that hands often feel very cold to him upon waking up in the morning, but wife reports they feel warm.   Had a mild leukocytosis and mildly abnormal serum electrophoresis upon last visit.  Fatigue, numb hands and feet, depression worst sxs. Comprehensive w/up has yielded little help. All tests reviewed again. Review of Systems Patrick Price    Objective:   Physical Exam  Constitutional: He is oriented to person, place, and time. He appears well-nourished. He appears distressed.  HENT:  Head: Normocephalic.  Mouth/Throat: Oropharynx is clear and moist.  Eyes: EOM are normal. Pupils are equal, round, and reactive to light. No scleral icterus.  Neck: Normal range of motion. Neck supple. No thyromegaly present.  Cardiovascular: Normal rate, regular rhythm and normal heart sounds.   Pulmonary/Chest: Effort normal and breath sounds normal.  Abdominal: Soft.  Neurological: He is alert and oriented to person, place, and time. He exhibits normal muscle tone. Coordination normal.  Skin: There is pallor.  Psychiatric: His speech is normal. Judgment and thought content normal. He is slowed. He is not agitated. Cognition and memory are normal. He exhibits a depressed mood.   Results for orders placed or performed in visit on 03/07/15  POCT CBC  Result Value Ref Range   WBC 8.0 4.6 - 10.2 K/uL   Lymph, poc 2.1 0.6 - 3.4   POC LYMPH PERCENT 26.2 10 - 50 %L   MID (cbc) 0.6 0 - 0.9   POC MID % 7.3 0 - 12 %M   POC Granulocyte 5.3 2 - 6.9   Granulocyte percent 66.5 37 - 80 %G   RBC 5.29 4.69 - 6.13 M/uL   Hemoglobin 16.0 14.1 - 18.1 g/dL   HCT, POC 78.247.4 95.643.5 - 53.7 %   MCV 89.6 80 - 97 fL   MCH, POC 30.3 27 - 31.2 pg   MCHC 33.8 31.8 - 35.4 g/dL   RDW, POC 21.313.9 %   Platelet Count, POC 188 142 - 424 K/uL   MPV 8.1 0 - 99.8 fL   Oximetry walking fast 98% unchanged       Assessment & Plan:  Refer to neurology Fatigue/Numbness and Pain/Feeling depressed

## 2015-03-07 NOTE — Patient Instructions (Addendum)
Peripheral Neuropathy Peripheral neuropathy is a type of nerve damage. It affects nerves that carry signals between the spinal cord and other parts of the body. These are called peripheral nerves. With peripheral neuropathy, one nerve or a group of nerves may be damaged.  CAUSES  Many things can damage peripheral nerves. For some people with peripheral neuropathy, the cause is unknown. Some causes include:  Diabetes. This is the most common cause of peripheral neuropathy.  Injury to a nerve.  Pressure or stress on a nerve that lasts a long time.  Too little vitamin B. Alcoholism can lead to this.  Infections.  Autoimmune diseases, such as multiple sclerosis and systemic lupus erythematosus.  Inherited nerve diseases.  Some medicines, such as cancer drugs.  Toxic substances, such as lead and mercury.  Too little blood flowing to the legs.  Kidney disease.  Thyroid disease. SIGNS AND SYMPTOMS  Different people have different symptoms. The symptoms you have will depend on which of your nerves is damaged. Common symptoms include:  Loss of feeling (numbness) in the feet and hands.  Tingling in the feet and hands.  Pain that burns.  Very sensitive skin.  Weakness.  Not being able to move a part of the body (paralysis).  Muscle twitching.  Clumsiness or poor coordination.  Loss of balance.  Not being able to control your bladder.  Feeling dizzy.  Sexual problems. DIAGNOSIS  Peripheral neuropathy is a symptom, not a disease. Finding the cause of peripheral neuropathy can be hard. To figure that out, your health care provider will take a medical history and do a physical exam. A neurological exam will also be done. This involves checking things affected by your brain, spinal cord, and nerves (nervous system). For example, your health care provider will check your reflexes, how you move, and what you can feel.  Other types of tests may also be ordered, such as:  Blood  tests.  A test of the fluid in your spinal cord.  Imaging tests, such as CT scans or an MRI.  Electromyography (EMG). This test checks the nerves that control muscles.  Nerve conduction velocity tests. These tests check how fast messages pass through your nerves.  Nerve biopsy. A small piece of nerve is removed. It is then checked under a microscope. TREATMENT   Medicine is often used to treat peripheral neuropathy. Medicines may include:  Pain-relieving medicines. Prescription or over-the-counter medicine may be suggested.  Antiseizure medicine. This may be used for pain.  Antidepressants. These also may help ease pain from neuropathy.  Lidocaine. This is a numbing medicine. You might wear a patch or be given a shot.  Mexiletine. This medicine is typically used to help control irregular heart rhythms.  Surgery. Surgery may be needed to relieve pressure on a nerve or to destroy a nerve that is causing pain.  Physical therapy to help movement.  Assistive devices to help movement. HOME CARE INSTRUCTIONS   Only take over-the-counter or prescription medicines as directed by your health care provider. Follow the instructions carefully for any given medicines. Do not take any other medicines without first getting approval from your health care provider.  If you have diabetes, work closely with your health care provider to keep your blood sugar under control.  If you have numbness in your feet:  Check every day for signs of injury or infection. Watch for redness, warmth, and swelling.  Wear padded socks and comfortable shoes. These help protect your feet.  Do not do   things that put pressure on your damaged nerve.  Do not smoke. Smoking keeps blood from getting to damaged nerves.  Avoid or limit alcohol. Too much alcohol can cause a lack of B vitamins. These vitamins are needed for healthy nerves.  Develop a good support system. Coping with peripheral neuropathy can be  stressful. Talk to a mental health specialist or join a support group if you are struggling.  Follow up with your health care provider as directed. SEEK MEDICAL CARE IF:   You have new signs or symptoms of peripheral neuropathy.  You are struggling emotionally from dealing with peripheral neuropathy.  You have a fever. SEEK IMMEDIATE MEDICAL CARE IF:   You have an injury or infection that is not healing.  You feel very dizzy or begin vomiting.  You have chest pain.  You have trouble breathing. Document Released: 10/10/2002 Document Revised: 07/02/2011 Document Reviewed: 06/27/2013 [facility] Patient Information 2015 [place], [place]. This information is not intended to replace advice given to you by your health care provider. Make sure you discuss any questions you have with your health care provider. Paresthesia Paresthesia is an abnormal burning or prickling sensation. This sensation is generally felt in the hands, arms, legs, or feet. However, it may occur in any part of the body. It is usually not painful. The feeling may be described as:  Tingling or numbness.  "Pins and needles."  Skin crawling.  Buzzing.  Limbs "falling asleep."  Itching. Most people experience temporary (transient) paresthesia at some time in their lives. CAUSES  Paresthesia may occur when you breathe too quickly (hyperventilation). It can also occur without any apparent cause. Commonly, paresthesia occurs when pressure is placed on a nerve. The feeling quickly goes away once the pressure is removed. For some people, however, paresthesia is a long-lasting (chronic) condition caused by an underlying disorder. The underlying disorder may be:  A traumatic, direct injury to nerves. Examples include a:  Broken (fractured) neck.  Fractured skull.  A disorder affecting the brain and spinal cord (central nervous system). Examples include:  Transverse myelitis.  Encephalitis.  Transient ischemic  attack.  Multiple sclerosis.  Stroke.  Tumor or blood vessel problems, such as an arteriovenous malformation pressing against the brain or spinal cord.  A condition that damages the peripheral nerves (peripheral neuropathy). Peripheral nerves are not part of the brain and spinal cord. These conditions include:  Diabetes.  Peripheral vascular disease.  Nerve entrapment syndromes, such as carpal tunnel syndrome.  Shingles.  Hypothyroidism.  Vitamin B12 deficiencies.  Alcoholism.  Heavy metal poisoning (lead, arsenic).  Rheumatoid arthritis.  Systemic lupus erythematosus. DIAGNOSIS  Your caregiver will attempt to find the underlying cause of your paresthesia. Your caregiver may:  Take your medical history.  Perform a physical exam.  Order various lab tests.  Order imaging tests. TREATMENT  Treatment for paresthesia depends on the underlying cause. HOME CARE INSTRUCTIONS  Avoid drinking alcohol.  You may consider massage or acupuncture to help relieve your symptoms.  Keep all follow-up appointments as directed by your caregiver. SEEK IMMEDIATE MEDICAL CARE IF:   You feel weak.  You have trouble walking or moving.  You have problems with speech or vision.  You feel confused.  You cannot control your bladder or bowel movements.  You feel numbness after an injury.  You faint.  Your burning or prickling feeling gets worse when walking.  You have pain, cramps, or dizziness.  You develop a rash. MAKE SURE YOU:  Understand these instructions.  Will watch your condition.  Will get help right away if you are not doing well or get worse. Document Released: 10/10/2002 Document Revised: 01/12/2012 Document Reviewed: 07/11/2011 [facility] Patient Information 2015 [place], [place]. This information is not intended to replace advice given to you by your health care provider. Make sure you discuss any questions you have with your health care provider. Adjustment  Disorder Most changes in life can cause stress. Getting used to changes may take a few months or longer. If feelings of stress, hopelessness, or worry continue, you may have an adjustment disorder. This stress-related mental health problem may affect your feelings, thinking and how you act. It occurs in both sexes and happens at any age. SYMPTOMS  Some of the following problems may be seen and vary from person to person:  Sadness or depression.  Loss of enjoyment.  Thoughts of suicide.  Fighting.  Avoiding family and friends.  Poor school performance.  Hopelessness, sense of loss.  Trouble sleeping.  Vandalism.  Worry, weight loss or gain.  Crying spells.  Anxiety  Reckless driving.  Skipping school.  Poor work International aid/development workerperformance.  Nervousness.  Ignoring bills.  Poor attitude. DIAGNOSIS  Your caregiver will ask what has happened in your life and do a physical exam. They will make a diagnosis of an adjustment disorder when they are sure another problem or medical illness causing your feelings does not exist. TREATMENT  When problems caused by stress interfere with you daily life or last longer than a few months, you may need counseling for an adjustment disorder. Early treatment may diminish problems and help you to better cope with the stressful events in your life. Sometimes medication is necessary. Individual counseling and or support groups can be very helpful. PROGNOSIS  Adjustment disorders usually last less than 3 to 6 months. The condition may persist if there is long lasting stress. This could include health problems, relationship problems, or job difficulties where you can not easily escape from what is causing the problem. PREVENTION  Even the most mentally healthy, highly functioning people can suffer from an adjustment disorder given a significant blow from a life-changing event. There is no way to prevent pain and loss. Most people need help from time to time. You  are not alone. SEEK MEDICAL CARE IF:  Your feelings or symptoms listed above do not improve or worsen. Document Released: 06/24/2006 Document Revised: 01/12/2012 Document Reviewed: 09/15/2007 [facility]Exit[facility] Patient Information 2015 [place]ExitCare, [place]LL[place]. This information is not intended to replace advice given to you by your health care provider. Make sure you discuss any questions you have with your health care provider.

## 2015-03-08 LAB — ANA: Anti Nuclear Antibody(ANA): NEGATIVE

## 2015-03-09 ENCOUNTER — Encounter: Payer: Self-pay | Admitting: Family Medicine

## 2015-03-09 ENCOUNTER — Ambulatory Visit (INDEPENDENT_AMBULATORY_CARE_PROVIDER_SITE_OTHER): Payer: Medicare Other | Admitting: Neurology

## 2015-03-09 ENCOUNTER — Encounter: Payer: Self-pay | Admitting: *Deleted

## 2015-03-09 ENCOUNTER — Encounter: Payer: Self-pay | Admitting: Neurology

## 2015-03-09 ENCOUNTER — Other Ambulatory Visit: Payer: Self-pay

## 2015-03-09 VITALS — BP 104/70 | HR 66 | Resp 18 | Ht 69.0 in | Wt 190.0 lb

## 2015-03-09 DIAGNOSIS — G6181 Chronic inflammatory demyelinating polyneuritis: Secondary | ICD-10-CM | POA: Diagnosis not present

## 2015-03-09 DIAGNOSIS — R209 Unspecified disturbances of skin sensation: Secondary | ICD-10-CM

## 2015-03-09 DIAGNOSIS — R208 Other disturbances of skin sensation: Secondary | ICD-10-CM

## 2015-03-09 DIAGNOSIS — R270 Ataxia, unspecified: Secondary | ICD-10-CM | POA: Diagnosis not present

## 2015-03-09 HISTORY — DX: Ataxia, unspecified: R27.0

## 2015-03-09 HISTORY — DX: Other disturbances of skin sensation: R20.8

## 2015-03-09 NOTE — Progress Notes (Signed)
Provider:  Melvyn Novasarmen  Leopoldo Mazzie, M D  Referring Provider: Jonita AlbeeGuest, Chris W, MD Primary Care Physician:  Tally DueGUEST, CHRIS WARREN, MD  Chief Complaint  Patient presents with  . Numbness    rm 10, alone, new patient    HPI:  Patrick Price is a 69 y.o. male seen here as a referral from Dr. Perrin MalteseGuest for "numbness" ,  Mr. Patrick Price reports that he had an visit of Bell's palsy affecting the left face on March 31 of this year 2016. First off numerous neurologic complaints. He woke up with that in the morning had not experienced any pain him a but the taste on the tip of his tongue was no longer there. He also said that his hearing did not change. He had numbness in the face on the left side as well.   Fearful he may have had a stroke he presented to his doctor who then called an ambulance and [facility] [place] [facility]. He was evaluated by MRI of the brain which did not show a stroke. Prednisone and acyclovir were prescribed for him at his kidneys could not tolerate acyclovir. Still in urological treatment, while taking the dicyclomine and prednisone he developed hand numbness and his hands felt cold painfully cold he states. He is also some numbness on the back. His toes are numb, too.  It is harder to walk.   At that when he gets up in the morning he feels strong enough and his sensory is not as off cord" but present 10-15 minutes the symptoms resume and walking is a strain for him now." i feel I got rocks for toes in my shoes. Toes feel alien". And last night at 60 degrees , he wore gloves as his hands were feeling cold and achy. His hands feel asleep.   I reviewed the patient's medication which includes metoprolol, atorvastatin, vitamin B12 aspirin, B complex and fish oil. I see none of these medications causing the side effect similar to what the patient presents for this.   Review of Systems: Out of a complete 14 system review, the patient complains of only the following symptoms, and all other  reviewed systems are negative. Sensory abnormalities.  Epworth score feeling hot, feeling cold especially arms and legs but mainly fingers and toes, he also noticed constipation and urination problems a runny nose skin sensitivity, weakness he cannot walk on his toes, shift work history restless leg history decreased energy, change in appetite, he lost 15 pounds of weight in 1 month, history of anxiety fatigue. He has a cardiac history of coronary artery disease and had a stent placed in his coronary artery of the year 2000. He works at [employer], he is a former tobacco user but quit smoking in 2007 he drinks about 2 cups of caffeine a day he does not drink alcohol and did not endorse any drugs.    History   Social History  . Marital Status: Married    Spouse Name: N/A  . Number of Children: N/A  . Years of Education: N/A   Occupational History  . Not on file.   Social History Main Topics  . Smoking status: Former Smoker -- 0.10 packs/day    Types: Cigarettes    Quit date: 02/15/2007  . Smokeless tobacco: Not on file  . Alcohol Use: No  . Drug Use: No  . Sexual Activity: Not on file   Other Topics Concern  . Not on file   Social History  Narrative   2-3 cups of caffeine daily    Family History  Problem Relation Age of Onset  . Heart attack Mother     Past Medical History  Diagnosis Date  . Hyperlipidemia   . Hypertension   . Stented coronary artery   . Coronary artery disease 01/16/11    nuclear stress test- low risk scan. no ischemia or infarct/scar is seen EF 77%  . Bell's palsy     Past Surgical History  Procedure Laterality Date  . Coronary angioplasty      Current Outpatient Prescriptions  Medication Sig Dispense Refill  . aspirin 325 MG tablet Take 325 mg by mouth daily.    [name]. atorvastatin (LIPITOR) 40 MG tablet Take 1 tablet (40 mg total) by mouth daily. *MUST KEEP APPOINTMENT* 30 tablet 1  . B Complex Vitamins (B COMPLEX-B12 PO) Take by mouth.    . fish  oil-omega-3 fatty acids 1000 MG capsule Take 2 g by mouth daily.    . metoprolol succinate (TOPROL-XL) 25 MG 24 hr tablet Take 1 tablet (25 mg total) by mouth daily. MUST KEEP APPOINTMENT 04/25/2015 WITH DR Tresa EndoKELLY FOR FUTURE REFILLS 60 tablet 1  . Multiple Vitamin (MULTIVITAMIN WITH MINERALS) TABS tablet Take 1 tablet by mouth daily.    . naproxen sodium (ANAPROX) 220 MG tablet Take 440 mg by mouth 2 (two) times daily with a meal.    . acyclovir (ZOVIRAX) 400 MG tablet Take 1 tablet (400 mg total) by mouth 5 (five) times daily. (Patient not taking: Reported on 02/08/2015) 35 tablet 0  . clonazePAM (KLONOPIN) 0.5 MG tablet Take 1 tablet (0.5 mg total) by mouth 2 (two) times daily as needed for anxiety. (Patient not taking: Reported on 03/09/2015) 20 tablet 1  . traMADol (ULTRAM) 50 MG tablet Take 1 tablet (50 mg total) by mouth every 6 (six) hours as needed for moderate pain. (Patient not taking: Reported on 02/25/2015) 15 tablet 0   No current facility-administered medications for this visit.    Allergies as of 03/09/2015  . (No Known Allergies)    Vitals: BP 104/70 mmHg  Pulse 66  Resp 18  Ht 5\' 9"  (1.753 m)  Wt 190 lb (86.183 kg)  BMI 28.05 kg/m2 Last Weight:  Wt Readings from Last 1 Encounters:  03/09/15 190 lb (86.183 kg)       Last Height:   Ht Readings from Last 1 Encounters:  03/09/15 5\' 9"  (1.753 m)    Physical exam:  General: The patient is awake, alert and appears not in acute distress. The patient is well groomed. Head: Normocephalic, atraumatic. Neck is supple. Mallampati 2,  neck circumference:15.75. Nasal airflow unrestricted, TMJ is  not evident . Cardiovascular:  Regular rate and rhythm , without  murmurs or carotid bruit, and without distended neck veins. Respiratory: Lungs are clear to auscultation. Skin:  Without evidence of edema, or rash Trunk: BMI iselevated and patient  has normal posture.  Neurologic exam : The patient is awake and alert, oriented to place and  time.   Memory subjective  described as intact. There is a normal attention span & concentration ability. Speech is fluent without dysarthria, dysphonia or aphasia. Mood and affect are appropriate.  Cranial nerves: Pupils are equal and briskly reactive to light. Funduscopic exam without  evidence of pallor or edema.  Extraocular movements  in vertical and horizontal planes intact and without nystagmus. Visual fields by finger perimetry are intact. Hearing to finger rub intact.  Facial sensation intact  to fine touch. Facial motor strength is symmetric and tongue and uvula move midline.  Motor exam: The patient presents with a normal baseline muscle tone there is no rigidity and no flaccidity. He has symmetric muscle build left was is right. He has a good grip strengths. What I noticed is that he cannot maintain a toe stand he was able however to walk a couple of steps on his heels.  Sensory:  Fine touch, pinprick and vibration were tested in all extremities, The complete loss of fine touch and vibration in all 10 toes to the mid tarsal joint. At the ankle level vibration was felt again and it was felt again at the knee.[name] Proprioception was mildly impaired and on finger-nose test the patient missed his nose. Coordination: Rapid alternating movements in the fingers/hands is normal. Finger-to-nose maneuver  without  Tremor,  But ataxia.  Gait and station: Patient walks without assistive device and is able unassisted to climb up to the exam table.  Strength within normal limits. Stance is stable and normal. Tandem gait is fragmented, he is ataxic ! . Romberg testing is positive . [name] The patient feels slightly lightheaded when he stands up or changes his posture which could be attributed to metoprolol.  Deep tendon reflexes: Was no Babinski response, no Achillis tendon reflex. The patella reflexes are intact bilaterally , the arm reflexes are attenuated but all elicitable. Assessment:  After physical and  neurologic examination, review of laboratory studies, imaging, neurophysiology testing and pre-existing records, assessment is    Mr. Galicia first diagnosis was a Bell's palsy on the left. This could be related to a spontaneous nerve inflammation but it is also apart of many autoimmune diseases such as sarcoidosis, or infection such as proptosis. What is concerning from there is that he started on prednisone and acyclovir file his facial droop resolved he develop her wrist. Symptoms and I see no an ataxic gentleman was very densely numb feet a sensory loss to position vibration and fine touch is evident as well as temperature. I doubt that he would even feel pain.  This affects his gait and balance , of course is patella reflex is absent. He has not noticed further ascending weakness but I do wonder if this is a beginning demyelinating neuropathy. AIDP, GBS? He has no shortness of breath,  I have plans to order a lumbar puncture and send fluid off to see cyto-albuminic dissociation.  The patient was advised of the nature of the diagnosed  disorder , the treatment options and risks for general a health and wellness arising from not treating the condition. Visit duration was 60 minutes.   Plan:  Treatment plan and additional workup : The patiene will need LP, cells and differential , OP, and EMG and NCV will be ordered.        Porfirio Mylar Trellis Guirguis MD  03/09/2015

## 2015-03-09 NOTE — Addendum Note (Signed)
Addended by: Geronimo RunningINKINS, Zackrey Dyar A on: 03/09/2015 10:26 AM   Modules accepted: Orders

## 2015-03-09 NOTE — Progress Notes (Signed)
Dr. Vickey Hugerohmeier asked to add Lyme disease to the labs for the LP. Added and new order created. Cancelled old order w/o the lyme order.

## 2015-03-13 ENCOUNTER — Other Ambulatory Visit: Payer: Self-pay

## 2015-03-19 ENCOUNTER — Ambulatory Visit
Admission: RE | Admit: 2015-03-19 | Discharge: 2015-03-19 | Disposition: A | Discharge status: Home / Self Care | Payer: Medicare Other | Source: Ambulatory Visit | Attending: Neurology | Admitting: Neurology

## 2015-03-19 ENCOUNTER — Other Ambulatory Visit: Payer: Self-pay | Admitting: Neurology

## 2015-03-19 DIAGNOSIS — R209 Unspecified disturbances of skin sensation: Secondary | ICD-10-CM | POA: Diagnosis not present

## 2015-03-19 DIAGNOSIS — R270 Ataxia, unspecified: Secondary | ICD-10-CM

## 2015-03-19 DIAGNOSIS — R51 Headache: Secondary | ICD-10-CM | POA: Diagnosis not present

## 2015-03-19 DIAGNOSIS — G6181 Chronic inflammatory demyelinating polyneuritis: Secondary | ICD-10-CM | POA: Diagnosis not present

## 2015-03-19 DIAGNOSIS — R202 Paresthesia of skin: Secondary | ICD-10-CM | POA: Diagnosis not present

## 2015-03-19 DIAGNOSIS — R208 Other disturbances of skin sensation: Secondary | ICD-10-CM

## 2015-03-19 LAB — CSF CELL COUNT WITH DIFFERENTIAL
RBC Count, CSF: 0 cu mm
Tube #: 3
WBC, CSF: 5 cu mm (ref 0–5)

## 2015-03-19 LAB — PROTEIN, CSF: Total Protein, CSF: 184 mg/dL — ABNORMAL HIGH (ref 15–45)

## 2015-03-19 LAB — GLUCOSE, CSF: Glucose, CSF: 60 mg/dL (ref 43–76)

## 2015-03-19 NOTE — Discharge Instructions (Signed)

## 2015-03-19 NOTE — Progress Notes (Signed)
1 SST tube drawn from right AC. Site is unremarkable and pt tolerated procedure well. JKL RN

## 2015-03-19 NOTE — Progress Notes (Signed)
[facility]; site unremarkable.  Discharge instructions explained to patient.  jkl

## 2015-03-21 ENCOUNTER — Ambulatory Visit (INDEPENDENT_AMBULATORY_CARE_PROVIDER_SITE_OTHER): Payer: Self-pay | Admitting: Neurology

## 2015-03-21 ENCOUNTER — Ambulatory Visit (INDEPENDENT_AMBULATORY_CARE_PROVIDER_SITE_OTHER): Payer: Medicare Other | Admitting: Neurology

## 2015-03-21 DIAGNOSIS — G6181 Chronic inflammatory demyelinating polyneuritis: Secondary | ICD-10-CM

## 2015-03-21 DIAGNOSIS — Z0289 Encounter for other administrative examinations: Secondary | ICD-10-CM

## 2015-03-21 DIAGNOSIS — R208 Other disturbances of skin sensation: Secondary | ICD-10-CM

## 2015-03-21 DIAGNOSIS — R270 Ataxia, unspecified: Secondary | ICD-10-CM | POA: Diagnosis not present

## 2015-03-21 DIAGNOSIS — G609 Hereditary and idiopathic neuropathy, unspecified: Secondary | ICD-10-CM

## 2015-03-21 DIAGNOSIS — R209 Unspecified disturbances of skin sensation: Secondary | ICD-10-CM

## 2015-03-21 NOTE — Progress Notes (Signed)
See procedure note.

## 2015-03-21 NOTE — Progress Notes (Signed)
  [facility]    Provider:  Dr Lucia GaskinsAhern Referring Provider: Jonita AlbeeGuest, Chris W, MD Primary Care Physician:  Tally DueGUEST, CHRIS WARREN, MD  HPI:  Patrick AlstromJack D Azpeitia is a 69 y.o. male here as a referral from Dr. Perrin MalteseGuest for evaluation of peripheral neuropathy. He reports numbness in the toes with difficulty walking. He feels like there are rocks in his shoes. Denies LBP or radicular symptoms. He has decreased sensation to all modalities distally on exam.   Summary: Nerve conduction studies were performed on the right upper and bilateral lower extremities.  The right Median motor nerve was within normal limits with normal F wave latency.  The right Median sensory nerve was within normal limits The right Ulnar motor nerve was within normal limits with normal F wave latency The right Ulnar sensory nerve was within normal limits The right Radial sensory nerve was within normal limits  The bilateral Peroneal motor nerves were within normal limits with normal F wave latencies The left tibial motor nerve showed reduced amplitude (2.0 mV, N>3) The right tibial motor nerve showed reduced amplitude (2.3 mV, N>3)  The left sural sensory nerve showed borderline amplitude (5.0 V, N>5).   The Left superficial peroneal sensory nerve showed reduced amplitude (5.0 V, N>8) The Right superficial peroneal sensory nerve showed reduced amplitude (5.0 V, N>8)   F Wave studies indicate that the left tibial F wave has prolonged latency (60 ms, N<58) F Wave studies indicate that the right tibial F wave has prolonged latency (62.2 ms, N<58)   The right and left tibial H-reflex was within normal limits  EMG needle study was performed on selected lower extremity muscles. The following right-sided muscles were within normal limits:  Medial Gastrocnemius, Extensor Hallucis Longus, Abductor Hallucis, Extensor Digitorum Brevis, Anterior Tibialis, Vastus Medialis, Biceps Femoris (long head), Gluteus Maximus, Gluteus  Medius. The bilateral L4/L5/S1 paraspinal muscles were normal.   Assessment/Plan: There is electrophysiologic evidence of a symmetric length-dependent moderately severe axonal sensorimotor polyneuropathy. No evidence for ulnar or median neuropathy or radiculopathy.  Clinical correlation recommended.   Naomie DeanAntonia Kacie Huxtable, MD  [facility]  [address]  [place], [place] [id number]  Phone [phone] Fax [phone]

## 2015-03-22 LAB — CSF CULTURE W GRAM STAIN
Gram Stain: NONE SEEN
Organism ID, Bacteria: NO GROWTH

## 2015-03-22 LAB — CSF CULTURE

## 2015-03-23 ENCOUNTER — Ambulatory Visit: Payer: Self-pay | Admitting: Neurology

## 2015-03-25 LAB — OLIGOCLONAL BANDS, CSF + SERM

## 2015-03-25 NOTE — Procedures (Signed)
[facility]    Provider:  Dr Lucia GaskinsAhern Referring Provider: Jonita AlbeeGuest, Chris W, MD Primary Care Physician:  Tally DueGUEST, CHRIS WARREN, MD  HPI:  Patrick AlstromJack D Reindel is a 69 y.o. male here as a referral from Dr. Perrin MalteseGuest for evaluation of peripheral neuropathy. He reports numbness in the toes with difficulty walking. He feels like there are rocks in his shoes. Denies LBP or radicular symptoms. He has decreased sensation to all modalities distally on exam.   Summary: Nerve conduction studies were performed on the right upper and bilateral lower extremities.  The right Median motor nerve was within normal limits with normal F wave latency.  The right Median sensory nerve was within normal limits The right Ulnar motor nerve was within normal limits with normal F wave latency The right Ulnar sensory nerve was within normal limits The right Radial sensory nerve was within normal limits  The bilateral Peroneal motor nerves were within normal limits with normal F wave latencies The left tibial motor nerve showed reduced amplitude (2.0 mV, N>3) The right tibial motor nerve showed reduced amplitude (2.3 mV, N>3)  The left sural sensory nerve showed borderline amplitude (5.0 V, N>5).   The Left superficial peroneal sensory nerve showed reduced amplitude (5.0 V, N>8) The Right superficial peroneal sensory nerve showed reduced amplitude (5.0 V, N>8)   F Wave studies indicate that the left tibial F wave has prolonged latency (60 ms, N<58) F Wave studies indicate that the right tibial F wave has prolonged latency (62.2 ms, N<58)   The right and left tibial H-reflex was within normal limits  EMG needle study was performed on selected lower extremity muscles. The following right-sided muscles were within normal limits:  Medial Gastrocnemius, Extensor Hallucis Longus, Abductor Hallucis, Extensor Digitorum Brevis, Anterior Tibialis, Vastus Medialis, Biceps Femoris (long head), Gluteus Maximus, Gluteus  Medius. The bilateral L4/L5/S1 paraspinal muscles were normal.   Assessment/Plan: There is electrophysiologic evidence of a symmetric length-dependent moderately severe axonal sensorimotor polyneuropathy. No evidence for ulnar or median neuropathy or radiculopathy.  Clinical correlation recommended.   Naomie DeanAntonia Ahern, MD  [facility]  [address]  [place], [place] [id number]  Phone [phone] Fax [phone]

## 2015-03-26 ENCOUNTER — Ambulatory Visit
Admission: RE | Admit: 2015-03-26 | Discharge: 2015-03-26 | Disposition: A | Discharge status: Home / Self Care | Payer: Medicare Other | Source: Ambulatory Visit | Attending: Neurology | Admitting: Neurology

## 2015-03-26 DIAGNOSIS — R270 Ataxia, unspecified: Secondary | ICD-10-CM

## 2015-03-26 DIAGNOSIS — R208 Other disturbances of skin sensation: Secondary | ICD-10-CM

## 2015-03-26 DIAGNOSIS — R209 Unspecified disturbances of skin sensation: Secondary | ICD-10-CM

## 2015-03-26 MED ORDER — GADOBENATE DIMEGLUMINE 529 MG/ML IV SOLN
17.0000 mL | Freq: Once | INTRAVENOUS | Status: AC | PRN
  Administered 2015-03-26: 17 mL via INTRAVENOUS

## 2015-03-27 ENCOUNTER — Encounter: Payer: Self-pay | Admitting: Neurology

## 2015-03-27 ENCOUNTER — Ambulatory Visit (INDEPENDENT_AMBULATORY_CARE_PROVIDER_SITE_OTHER): Payer: Medicare Other | Admitting: Neurology

## 2015-03-27 VITALS — BP 132/72 | HR 76 | Resp 20 | Ht 68.9 in | Wt 185.0 lb

## 2015-03-27 DIAGNOSIS — G510 Bell's palsy: Secondary | ICD-10-CM | POA: Diagnosis not present

## 2015-03-27 DIAGNOSIS — G608 Other hereditary and idiopathic neuropathies: Secondary | ICD-10-CM | POA: Diagnosis not present

## 2015-03-27 LAB — B. BURGDORFI ANTIBODIES, CSF: Lyme Ab: NEGATIVE

## 2015-03-27 MED ORDER — GABAPENTIN 300 MG PO CAPS: 300.0000 mg | ORAL_CAPSULE | Freq: Every day | ORAL | Status: DC

## 2015-03-27 NOTE — Patient Instructions (Signed)

## 2015-03-27 NOTE — Progress Notes (Signed)
Provider:  Melvyn Price, M D  Referring Provider: Jonita Albee, MD Primary Care Physician:  Patrick Due, MD  Chief Complaint  Patient presents with  . Follow-up    LP and MRI results, rm 11, alone    HPI:  Patrick Price is a 69 y.o. male seen here as a referral from Dr. Perrin Price for "numbness" ,  Mr. Patrick Price reports that he had an visit of Bell's palsy affecting the left face on March 31 of this year 2016. First off numerous neurologic complaints. He woke up with that in the morning had not experienced any pain him a but the taste on the tip of his tongue was no longer there. He also said that his hearing did not change. He had numbness in the face on the left side as well.  Fearful he may have had a stroke he presented to his doctor who then called an ambulance and referred him to [facility]. He was evaluated by MRI of the brain which did not show a stroke. Prednisone and acyclovir were prescribed for him at his kidneys could not tolerate acyclovir. Still in urological treatment, while taking the dicyclomine and prednisone he developed hand numbness and his hands felt cold painfully cold he states. He is also some numbness on the back. His toes are numb, too.  It is harder to walk. At that when he gets up in the morning he feels strong enough and his sensory is not as off cord" but present 10-15 minutes the symptoms resume and walking is a strain for him now." I feel, I got rocks for toes in my shoes". " the big toes feel alien, dead".  And last night at 60 degrees , he wore gloves as his hands were feeling cold and achy. His hands feel asleep. I reviewed the patient's medication which includes metoprolol, atorvastatin, vitamin B12 aspirin, B complex and fish oil. I see none of these medications causing the side effect similar to what the patient presents for this.  03-27-15 The patient reports increasing foot pain, he now wears sandals and socks only, The pain increases  as the day goes on. He feels more clumsiness and numbness in his fingers, too. In the morning  He feels pain free, but at night he has a deep, aching soreness, his big toes are numb. At night , his fingers do not hurt or feel different.  In addition , he reports having lost 20 pounds over the last 2 month. This may be related to the reported Bell's palsy his taste settings changed at the time and he had also a number she tongue and tingling numbness of the lips. This perioral numbness is still there, but faintly.   Review of the tests we have done since his last visit Dr. Lucia Price performed on 03-21-15 and EMG with nerve conduction study that showed a evidence of symmetric length dependent, moderately severe, axonal sensorimotor neuropathy. There is no demyelinating evidence. There was no delayed conduction. The patient underwent a cerebral spinal fluid test which showed high protein, normal glucose and 5 white blood cells. The cells were not differentiated further. The protein level was 184 but a normal level is not about 45 mg/dL. Gram stain showed just mononuclear white blood cells, oligoclonal bands were negative Lyme disease is pending.  The patient underwent his MRI of the cervical spine on 5-20 3-16 the disc interspaces were normal there was minimal right greater than left bone  spurring between C4 and cervical vertebrae 5. There was no significant narrowing of the external neural foramina. There was no nerve root impingement except for C6 and C7 no definite cord compression here at an encroachment is possible on the exiting left C7 nerve root. The patient has no corresponding symptoms. Between C7 and thoracic one the disc and interspaces appear normal. Normal enhancement and no cord abnormalities  The same is true for the thoracic spine which was also imaged on 5-20 3-16, interpreted by Dr. Rebecka Price. Small right paramedian disc protrusion at T6 and T7 small left paramedian disc protrusion at T8 and T9 spinal  cord is normal no enhancement is noted. Findings. A spinal stenosis as an explanation for the elevated protein is thereby ruled out. The patient is not diabetic, he is a nonsmoker nondrinker he did used tobacco until about 10 years ago. He just recovered from a cold.     Review of Systems: Out of a complete 14 system review, the patient complains of only the following symptoms, and all other reviewed systems are negative. Sensory abnormalities.  Epworth score feeling hot, feeling cold especially arms and legs but mainly fingers and toes, he also noticed constipation and urination problems a runny nose skin sensitivity, weakness he cannot walk on his toes, shift work history restless leg history decreased energy, change in appetite, he lost 15 pounds of weight in 1 month, history of anxiety fatigue. He has a cardiac history of coronary artery disease and had a stent placed in his coronary artery of the year 2000. He works at [employer], he is a former tobacco user but quit smoking in 2007 he drinks about 2 cups of caffeine a day he does not drink alcohol and did not endorse any drugs.     History   Social History  . Marital Status: Married    Spouse Name: N/A  . Number of Children: N/A  . Years of Education: N/A   Occupational History  . Not on file.   Social History Main Topics  . Smoking status: Former Smoker -- 0.10 packs/day    Types: Cigarettes    Quit date: 02/15/2007  . Smokeless tobacco: Not on file  . Alcohol Use: No  . Drug Use: No  . Sexual Activity: Not on file   Other Topics Concern  . Not on file   Social History Narrative   2-3 cups of caffeine daily    Family History  Problem Relation Age of Onset  . Heart attack Mother     Past Medical History  Diagnosis Date  . Hyperlipidemia   . Hypertension   . Stented coronary artery   . Coronary artery disease 01/16/11    nuclear stress test- low risk scan. no ischemia or infarct/scar is seen EF 77%  . Bell's palsy    . Ataxia 03/09/2015  . Loss of touch sensation on examination 03/09/2015    Past Surgical History  Procedure Laterality Date  . Coronary angioplasty      Current Outpatient Prescriptions  Medication Sig Dispense Refill  . aspirin 325 MG tablet Take 325 mg by mouth daily.    [name] atorvastatin (LIPITOR) 40 MG tablet Take 1 tablet (40 mg total) by mouth daily. *MUST KEEP APPOINTMENT* 30 tablet 1  . B Complex Vitamins (B COMPLEX-B12 PO) Take by mouth.    . clonazePAM (KLONOPIN) 0.5 MG tablet Take 1 tablet (0.5 mg total) by mouth 2 (two) times daily as needed for anxiety. 20 tablet 1  .  fish oil-omega-3 fatty acids 1000 MG capsule Take 2 g by mouth daily.    . metoprolol succinate (TOPROL-XL) 25 MG 24 hr tablet Take 1 tablet (25 mg total) by mouth daily. MUST KEEP APPOINTMENT 04/25/2015 WITH DR Tresa EndoKELLY FOR FUTURE REFILLS 60 tablet 1  . Multiple Vitamin (MULTIVITAMIN WITH MINERALS) TABS tablet Take 1 tablet by mouth daily.    . naproxen sodium (ANAPROX) 220 MG tablet Take 440 mg by mouth 2 (two) times daily with a meal.    . traMADol (ULTRAM) 50 MG tablet Take 1 tablet (50 mg total) by mouth every 6 (six) hours as needed for moderate pain. 15 tablet 0   No current facility-administered medications for this visit.    Allergies as of 03/27/2015  . (No Known Allergies)    Vitals: BP 132/72 mmHg  Pulse 76  Resp 20  Ht 5' 8.9" (1.75 m)  Wt 185 lb (83.915 kg)  BMI 27.40 kg/m2 Last Weight:  Wt Readings from Last 1 Encounters:  03/27/15 185 lb (83.915 kg)       Last Height:   Ht Readings from Last 1 Encounters:  03/27/15 5' 8.9" (1.75 m)    Physical exam:  General: The patient is awake, alert and appears not in acute distress. The patient is well groomed. Head: Normocephalic, atraumatic. Neck is supple. Mallampati 2,  neck circumference:15.75. Nasal airflow unrestricted, TMJ is  not evident .  Cardiovascular:  Regular rate and rhythm, without  murmurs or carotid bruit, and without  distended neck veins. Respiratory: Lungs are clear to auscultation. Skin:  Without evidence of edema, or rash Trunk: BMI iselevated and patient  has normal posture.  Neurologic exam : The patient is awake and alert, oriented to place and time.   Memory subjective  described as intact. There is a normal attention span & concentration ability. Speech is fluent without dysarthria, dysphonia or aphasia. Mood and affect are appropriate.  Cranial nerves: Pupils are equal and briskly reactive to light. Funduscopic exam without  evidence of pallor or edema.  Extraocular movements  in vertical and horizontal planes intact and without nystagmus. Visual fields by finger perimetry are intact. Hearing to finger rub intact.  Facial sensation intact to fine touch. Facial motor strength is symmetric and tongue and uvula move midline.  Motor exam: The patient presents with a normal baseline muscle tone there is no rigidity and no flaccidity. He has symmetric muscle bulk ,  He has good grip strengths. What I noticed, is that he cannot maintain a toe stand- he was able however to walk a couple of steps on his heels.  Sensory:  Fine touch, pinprick and vibration were tested in all extremities, The complete loss of fine touch and vibration in all 10 toes to the mid tarsal joint. At the ankle level vibration was felt again and it was felt again at the knee.[name]. Proprioception was mildly impaired and on finger-nose test the patient missed his nose.  Coordination: Rapid alternating movements in the fingers/hands is normal. Finger-to-nose maneuver  without  Tremor,  but ataxia.  Gait and station: Patient walks without assistive device and is able unassisted to climb up to the exam table.  Strength within normal limits. Stance is stable and normal. Tandem gait is fragmented, he is ataxic ! . Romberg testing is positive . [name]. The patient feels slightly lightheaded when he stands up or changes his posture which could be  attributed to metoprolol.  Deep tendon reflexes: Was no Babinski response, no  Achillis tendon reflex. The patella reflexes are intact bilaterally , the arm reflexes are attenuated but all elicitable. Assessment:  After physical and neurologic examination, review of laboratory studies, imaging, neurophysiology testing and pre-existing records, assessment is     The patient was advised of the nature of the diagnosed  disorder , the treatment options and risks for general a health and wellness arising from not treating the condition. Visit duration was 60 minutes.   Plan:  Treatment plan and additional workup :  The patient has progressive loss of sensory and motor strength in both lower extremities now beginning also to affect his hands and fingers more and more. The character of this polyneuropathy was a length dependent sensory and motor neuropathy with axonal origin. #2 there is C2 other Minnick dissociation noted in his cerebrospinal fluid. #3 there is no cervical or thoracic spinal stenosis explaining the symptoms. #4 he has overcome a Bell's palsy but still has some taste and facial sensory changes. He lost weight which could be attributed to the change of taste but should also be warrant a paraneoplastic evaluation. I spoke with my colleague Dr. Lucia Price today and also with the patient and explained that she will take the neuropathy workup over from here. Planned is likely a nerve biopsy for the sural nerve. The patient is aware of this plan and Dr. Lucia Price will see him within this week or the next week to have close follow-up. On this visit will be a no charge visit.      Porfirio Mylar Yazaira Speas MD  03/27/2015

## 2015-03-28 ENCOUNTER — Ambulatory Visit (INDEPENDENT_AMBULATORY_CARE_PROVIDER_SITE_OTHER): Payer: Medicare Other | Admitting: Neurology

## 2015-03-28 ENCOUNTER — Encounter: Payer: Self-pay | Admitting: Neurology

## 2015-03-28 VITALS — BP 118/78 | HR 59 | Temp 97.4°F | Ht 69.0 in | Wt 186.4 lb

## 2015-03-28 DIAGNOSIS — G609 Hereditary and idiopathic neuropathy, unspecified: Secondary | ICD-10-CM | POA: Diagnosis not present

## 2015-03-28 DIAGNOSIS — E519 Thiamine deficiency, unspecified: Secondary | ICD-10-CM

## 2015-03-28 DIAGNOSIS — R270 Ataxia, unspecified: Secondary | ICD-10-CM

## 2015-03-28 DIAGNOSIS — E531 Pyridoxine deficiency: Secondary | ICD-10-CM | POA: Diagnosis not present

## 2015-03-28 DIAGNOSIS — G6181 Chronic inflammatory demyelinating polyneuritis: Secondary | ICD-10-CM | POA: Diagnosis not present

## 2015-03-28 MED ORDER — PREDNISONE 10 MG PO TABS: ORAL_TABLET | ORAL | Status: DC

## 2015-03-28 NOTE — Progress Notes (Addendum)
[facility]    Provider:  Dr Lucia Gaskins Referring Provider: Jonita Albee, MD Primary Care Physician:  Tally Due, MD  CC:  neuropathy  HPI:  Patrick Price is a 69 y.o. male here as a referral from Dr. Perrin Maltese for neuroapthy  In March 31st of this year he had Bell's Palsy and then he started noticing the sensation in his feet and hands. Feels like rocks in the shoes, tightening on the toes, symmetric bilterally.Fingers are numb, like somebody else's fingers. Toes are very numb.  Very cold. Never had anything similar to this. Ataxic and falling, can't hol don to tools and drops them, is scared to be on a tall ladder at work. The symptoms are better in the morning but they are continuous all day. Symptoms started then progressed, unclear possibly stabilized at this point or maybe very slowly progressive. Balance has been poor. He has been stumbling lately. No cramping. Just numb, and paresthesias. Hurting last evening in bed. No low back pain. No radicular symptoms. He reports a 30 pound unexplained weight loss since symptoms started. He reports distal weakness and ataxia.  Reviewed notes, labs and imaging from outside physicians, which showed: LP glucose was 60, high protein 184 with low cells, Lyme negative  Serum studies include normal rheumatoid factor, CRP, sedimentation rate, B12, ANA, TSH, IFE  Personal reviewed the following images and agree with findings  MRI c-spine: This MRI of the cervical spine with and without contrast shows the following: 1. Degenerative changes at C4-C5, C5-C6 and C6-C7 as detailed above. C6-C7 there is moderate left foraminal narrowing associated with some encroachment on the exiting left C7 nerve root. 2. The spinal cord appears normal. 3. There is a normal enhancement pattern. There are no acute findings.  MRI thoracic spine: This MRI of the thoracic spine with and without contrast shows the following: 1. Small right paramedian  disc protrusion at T6-T7, Small left paramedian disc protrusion at T8-T9 and small left paramedian disc bulge at T10-T11. These do not lead to any nerve root impingement. 2. The spinal cord appears normal throughout the imaged thoracic course.  3. There is a normal enhancement pattern. There are no acute findings   EMG results showed moderate sensory motor axonal neuropathy, length dependent  Review of Systems: Patient complains of symptoms per HPI as well as the following symptoms: Weight loss, feeling cold, weakness, numbness, restless legs, decreased energy, change in appetite. Pertinent negatives per HPI. All others negative.   History   Social History  . Marital Status: Married    Spouse Name: N/A  . Number of Children: 2  . Years of Education: 12   Occupational History  . 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 Smoking status: Former Smoker -- 0.10 packs/day    Types: Cigarettes    Quit date: 02/15/2007  . Smokeless tobacco: Not on file  . Alcohol Use: No  . Drug Use: No  . Sexual Activity: Not on file   Other Topics Concern  . Not on file   Social History Narrative   2-3 cups of caffeine daily    Family History  Problem Relation Age of Onset  . Heart attack Mother     Past Medical History  Diagnosis Date  . Hyperlipidemia   . Hypertension   . Stented coronary artery   . Coronary artery disease 01/16/11    nuclear stress test- low risk scan. no ischemia or infarct/scar  is seen EF 77%  . Bell's palsy   . Ataxia 03/09/2015  . Loss of touch sensation on examination 03/09/2015    Past Surgical History  Procedure Laterality Date  . Coronary angioplasty      Current Outpatient Prescriptions  Medication Sig Dispense Refill  . aspirin 325 MG tablet Take 325 mg by mouth daily.    [name]. atorvastatin (LIPITOR) 40 MG tablet Take 1 tablet (40 mg total) by mouth daily. *MUST KEEP APPOINTMENT* 30 tablet 1  . B Complex Vitamins (B  COMPLEX-B12 PO) Take by mouth.    . fish oil-omega-3 fatty acids 1000 MG capsule Take 2 g by mouth daily.    . metoprolol succinate (TOPROL-XL) 25 MG 24 hr tablet Take 1 tablet (25 mg total) by mouth daily. MUST KEEP APPOINTMENT 04/25/2015 WITH DR Tresa EndoKELLY FOR FUTURE REFILLS 60 tablet 1  . Multiple Vitamin (MULTIVITAMIN WITH MINERALS) TABS tablet Take 1 tablet by mouth daily.    . clonazePAM (KLONOPIN) 0.5 MG tablet Take 1 tablet (0.5 mg total) by mouth 2 (two) times daily as needed for anxiety. (Patient not taking: Reported on 03/28/2015) 20 tablet 1  . gabapentin (NEURONTIN) 300 MG capsule Take 1 capsule (300 mg total) by mouth at bedtime. (Patient not taking: Reported on 03/28/2015) 90 capsule 3  . naproxen sodium (ANAPROX) 220 MG tablet Take 440 mg by mouth 2 (two) times daily with a meal.    . traMADol (ULTRAM) 50 MG tablet Take 1 tablet (50 mg total) by mouth every 6 (six) hours as needed for moderate pain. (Patient not taking: Reported on 03/28/2015) 15 tablet 0   No current facility-administered medications for this visit.    Allergies as of 03/28/2015  . (No Known Allergies)    Vitals: BP 118/78 mmHg  Pulse 59  Temp(Src) 97.4 F (36.3 C)  Ht 5\' 9"  (1.753 m)  Wt 186 lb 6.4 oz (84.55 kg)  BMI 27.51 kg/m2 Last Weight:  Wt Readings from Last 1 Encounters:  03/28/15 186 lb 6.4 oz (84.55 kg)   Last Height:   Ht Readings from Last 1 Encounters:  03/28/15 5\' 9"  (1.753 m)   Physical exam: Exam: Gen: NAD, conversant, well nourised, well groomed                     CV: RRR, no MRG. No Carotid Bruits. No peripheral edema, warm, nontender Eyes: Conjunctivae clear without exudates or hemorrhage MSK: +SLR   Neuro: Detailed Neurologic Exam  Speech:    Speech is normal; fluent and spontaneous with normal comprehension.  Cognition:    The patient is oriented to person, place, and time;     recent and remote memory intact;     language fluent;     normal attention, concentration,      fund of knowledge Cranial Nerves:    The pupils are equal, round, and reactive to light.  Visual fields are full to finger confrontation. Extraocular movements are intact. Trigeminal sensation is intact and the muscles of mastication are normal. The face is symmetric. The palate elevates in the midline. Hearing intact. Voice is normal. Shoulder shrug is normal. The tongue has normal motion without fasciculations.   Coordination:    No dysmetria  Gait:    No ataxia noted  Motor Observation:    No asymmetry, no atrophy, and no involuntary movements noted. Tone:    Normal muscle tone.    Posture:    Posture is normal. normal erect  Strength: Mild distal weakness otherwise strength is V/V in the upper and lower limbs.      Sensation: Decreased pinprick, temperature, vibration distally in the lower extremities. Mildly impaired proprioception distally in the lower extremities. Positive Romberg     Reflex Exam:  DTR's: Absent Achilles, brisk patellars       Toes:    The toes are downgoing bilaterally.   Clonus:    Clonus is absent.       Assessment/Plan:  This is a 69 year old patient with a past medical history of hyperlipidemia, hypertension, coronary artery disease status post stenting who presented with acute onset distal ascending sensory changes,ataxia, distal weakness along with a Bell's palsy. Lumbar puncture showed albuminocytologic dissociation with very high protein of 184, MRI of the cervical and thoracic spines were unremarkable.  Will order further lab work including paraneoplastic panel given patient's weight loss, vasculitic labs. We will order MRI of the lumbar spine to further evaluate, will include contrast to evaluate for enhancement of nerve roots, will look for hypertrophy of nerve roots which are sometimes seen in Guillain-Barr or CIDP EMG nerve conduction study did not show any demyelinating latencies or conduction block however given lumbar findings and  acute onset, will trial a course of steroids. Patient is to follow up with me in 3 months.   Naomie Dean, MD  [facility] [address] [place], [place] [address]  Phone [phone] Fax [phone]  A total of 30 minutes was spent face-to-face with this patient. Over half this time was spent on counseling patient on the neuropathy diagnosis and different diagnostic and therapeutic options available.   Approval code [phone]

## 2015-03-28 NOTE — Progress Notes (Signed)
I agree with the assessment and plan as directed by NP .The patient is known to me .   Zavier Canela, MD  

## 2015-03-28 NOTE — Patient Instructions (Addendum)
Overall you are doing fairly well but I do want to suggest a few things today:   Remember to drink plenty of fluid, eat healthy meals and do not skip any meals. Try to eat protein with a every meal and eat a healthy snack such as fruit or nuts in between meals. Try to keep a regular sleep-wake schedule and try to exercise daily, particularly in the form of walking, 20-30 minutes a day, if you can.   Diagnostic tests: Labs today, MRI of the lumbar spine with contrast As far as your medications are concerned, I would like to suggest:  Weeks 1-2 : 60 mg prednisone daily Week 3 : 50 mg daily Week 4: 40 mg daily Week 5: 30 mg daily Week 6 : 20mg  daily Week 7: 10 mg daily Week 8-12: 10 mg daily  I would like to see you back in 3 months, sooner if we need to. Please call us with any interim questions, concerns, problems, updates or refill requests.   Please also call us for any test results so we can go over those with you on the phone.  My clinical assistant and will answer any of your questions and relay your messages to me and also relay most of my messages to you.   Our phone number is [phone]. We also have an after hours call service for urgent matters and there is a physician on-call for urgent questions. For any emergencies you know to call 911 or go to the nearest emergency room

## 2015-03-30 ENCOUNTER — Ambulatory Visit
Admission: RE | Admit: 2015-03-30 | Discharge: 2015-03-30 | Disposition: A | Discharge status: Home / Self Care | Payer: Medicare Other | Source: Ambulatory Visit | Attending: Neurology | Admitting: Neurology

## 2015-03-30 DIAGNOSIS — G609 Hereditary and idiopathic neuropathy, unspecified: Secondary | ICD-10-CM

## 2015-03-30 DIAGNOSIS — G6181 Chronic inflammatory demyelinating polyneuritis: Secondary | ICD-10-CM | POA: Diagnosis not present

## 2015-03-30 DIAGNOSIS — R270 Ataxia, unspecified: Secondary | ICD-10-CM | POA: Diagnosis not present

## 2015-03-30 MED ORDER — GADOBENATE DIMEGLUMINE 529 MG/ML IV SOLN
17.0000 mL | Freq: Once | INTRAVENOUS | Status: AC | PRN
  Administered 2015-03-30: 17 mL via INTRAVENOUS

## 2015-04-02 LAB — PAN-ANCA
ANCA Proteinase 3: <3.5 U/mL (ref 0.0–3.5)
Atypical pANCA: <1:20 {titer}
C-ANCA: <1:20 {titer}
Myeloperoxidase Ab: <9 U/mL (ref 0.0–9.0)
P-ANCA: <1:20 {titer}

## 2015-04-02 LAB — HEMOGLOBIN A1C
Est. average glucose Bld gHb Est-mCnc: 108 mg/dL
Hgb A1c MFr Bld: 5.4 % (ref 4.8–5.6)

## 2015-04-02 LAB — GLIADIN ANTIBODIES, SERUM
Antigliadin Abs, IgA: 6 units (ref 0–19)
Gliadin IgG: 1 units (ref 0–19)

## 2015-04-02 LAB — BASIC METABOLIC PANEL
BUN/Creatinine Ratio: 19 (ref 10–22)
BUN: 20 mg/dL (ref 8–27)
CO2: 19 mmol/L (ref 18–29)
Calcium: 9.4 mg/dL (ref 8.6–10.2)
Chloride: 103 mmol/L (ref 97–108)
Creatinine, Ser: 1.04 mg/dL (ref 0.76–1.27)
GFR calc Af Amer: 85 mL/min/{1.73_m2} (ref >59)
GFR calc non Af Amer: 73 mL/min/{1.73_m2} (ref >59)
Glucose: 94 mg/dL (ref 65–99)
Potassium: 4.3 mmol/L (ref 3.5–5.2)
Sodium: 140 mmol/L (ref 134–144)

## 2015-04-02 LAB — RHEUMATOID FACTOR: Rhuematoid fact SerPl-aCnc: 11.1 IU/mL (ref 0.0–13.9)

## 2015-04-02 LAB — PARANEOPLASTIC PROFILE 1
Neuronal Nuc Ab (Ri), IFA: <1:10 {titer}
Neuronal Nuclear (Hu) Antibody (IB): <1:10 {titer}
Purkinje Cell (Yo) Autoantobodies- IFA: <1:10 {titer}

## 2015-04-02 LAB — VITAMIN B6: Vitamin B6: 8.8 ug/L (ref 5.3–46.7)

## 2015-04-02 LAB — HEAVY METALS, BLOOD
Arsenic: 5 ug/L (ref 2–23)
Lead, Blood: 2 ug/dL (ref 0–19)
Mercury: 1.3 ug/L (ref 0.0–14.9)

## 2015-04-02 LAB — HEPATITIS C ANTIBODY: Hep C Virus Ab: <0.1 s/co ratio (ref 0.0–0.9)

## 2015-04-02 LAB — SJOGREN'S SYNDROME ANTIBODS(SSA + SSB)
ENA SSA (RO) Ab: <0.2 AI (ref 0.0–0.9)
ENA SSB (LA) Ab: <0.2 AI (ref 0.0–0.9)

## 2015-04-02 LAB — RPR: RPR Ser Ql: NONREACTIVE

## 2015-04-02 LAB — VITAMIN B1: Thiamine: 108.5 nmol/L (ref 66.5–200.0)

## 2015-04-02 LAB — CRYOGLOBULIN

## 2015-04-02 LAB — ANGIOTENSIN CONVERTING ENZYME: Angio Convert Enzyme: 37 U/L (ref 14–82)

## 2015-04-02 LAB — HIV ANTIBODY (ROUTINE TESTING W REFLEX): HIV Screen 4th Generation wRfx: NONREACTIVE

## 2015-04-03 ENCOUNTER — Telehealth: Payer: Self-pay | Admitting: Neurology

## 2015-04-03 ENCOUNTER — Telehealth: Payer: Self-pay | Admitting: *Deleted

## 2015-04-03 NOTE — Telephone Encounter (Signed)
I faxed [facility] records to [facility] on 04/03/15.

## 2015-04-03 NOTE — Telephone Encounter (Signed)
Spoke to patient. Given the LP results of high protein without cells and lumbar spine showing enhancement of the nerve roots, this is c/w CIDP. Patient states the steroids have helped his hands but his feet continue to worsen. He has been on the steroids a week. Asked him to see what happens over the next week or two and if no improvements are seen may add another immunosuppressant or consider other CIDP treatment.

## 2015-04-03 NOTE — Telephone Encounter (Signed)
Spoke with pt about normal lab results and patient stated he already spoke with Dr. Lucia GaskinsAhern about MRI results. He stated he will call back in a couple weeks per Dr. Lucia GaskinsAhern request. Told pt to call back if he had any more questions. Pt verbalized understanding.

## 2015-04-16 ENCOUNTER — Telehealth: Payer: Self-pay | Admitting: *Deleted

## 2015-04-16 NOTE — Telephone Encounter (Signed)
Improvement in the face and lips and hands. But the toes remain numb. I would like him to stay on the steroids for another 2 weeks then come and see me.  Kara Mead - can you schedule him for a 30 minute follow up in 2 weeks please? Thank you

## 2015-04-16 NOTE — Telephone Encounter (Signed)
Called pt back and he said he was calling because Dr. Lucia Gaskins asked him to call back in a couple weeks. He stated while taking the prednisone, his lips, tongue, hands are 80% better. But his toes are still completely numb and there has been no improvement. He stated when gets up in morning they are almost normal, but an hour later they get numb, "frozen" feeling. He says he cannot wear shoes at all right now. I told him I would let Dr. Lucia Gaskins know and we would call him back. Pt verbalized understanding.

## 2015-04-16 NOTE — Telephone Encounter (Signed)
Patient called to give and update on symptoms since starting prednisone. Please call and advise. Patient can be reached at [phone].

## 2015-04-17 NOTE — Telephone Encounter (Signed)
Thank you :)

## 2015-04-17 NOTE — Telephone Encounter (Signed)
Scheduled 2 week f/u for May 02, 2015 at 2:30pm for . Told pt to arrive at 2:15pm. Pt verbalized understanding.

## 2015-04-25 ENCOUNTER — Ambulatory Visit (INDEPENDENT_AMBULATORY_CARE_PROVIDER_SITE_OTHER): Payer: Medicare Other | Admitting: Cardiovascular Disease

## 2015-04-25 VITALS — BP 122/74 | HR 57 | Ht 69.0 in | Wt 195.6 lb

## 2015-04-25 DIAGNOSIS — G510 Bell's palsy: Secondary | ICD-10-CM

## 2015-04-25 DIAGNOSIS — E789 Disorder of lipoprotein metabolism, unspecified: Secondary | ICD-10-CM

## 2015-04-25 DIAGNOSIS — Z79899 Other long term (current) drug therapy: Secondary | ICD-10-CM

## 2015-04-25 DIAGNOSIS — G6181 Chronic inflammatory demyelinating polyneuritis: Secondary | ICD-10-CM

## 2015-04-25 DIAGNOSIS — I2510 Atherosclerotic heart disease of native coronary artery without angina pectoris: Secondary | ICD-10-CM

## 2015-04-25 DIAGNOSIS — E785 Hyperlipidemia, unspecified: Secondary | ICD-10-CM | POA: Diagnosis not present

## 2015-04-25 DIAGNOSIS — Z955 Presence of coronary angioplasty implant and graft: Secondary | ICD-10-CM | POA: Diagnosis not present

## 2015-04-25 MED ORDER — METOPROLOL SUCCINATE ER 25 MG PO TB24: 25.0000 mg | ORAL_TABLET | Freq: Every day | ORAL | Status: DC

## 2015-04-25 NOTE — Patient Instructions (Signed)
Your physician wants you to follow-up in: 1 Year You will receive a reminder letter in the mail two months in advance. If you don't receive a letter, please call our office to schedule the follow-up appointment.  Your physician recommends that you return for lab work CBC, CMP, FASTING LIPIDS

## 2015-04-26 ENCOUNTER — Encounter: Payer: Self-pay | Admitting: Cardiovascular Disease

## 2015-04-26 NOTE — Progress Notes (Signed)
Patient ID: Patrick Price, male   DOB: [birth year], 69 y.o.   MRN: [id number]     HPI: Patrick Price is a 69 y.o. male who presents for and a 20 month cardiology evaluation.  Patrick Price has established CAD underwent PTCA and stenting of his RCA in 1999. He has a long-standing history of prior tobacco use but quit smoking  when he turns 60. He does have a history of hyperlipidemia. A two-year followup nuclear perfusion study in May 2014  continued to demonstrate normal perfusion without scar or ischemia on his medical therapy.  He has remained active. He denies chest pain. He denies shortness of breath. He exercises at the gym at least 3 days per week.  He has a history of hyperlipidemia and has been on atorvastatin 40 mg daily in addition to fish oil.  He has been on Toprol-XL 25 mg daily.  In March 2016.  He developed Bell's palsy.  Ultimately this improved.  However, 2 weeks later he developed some numbness of his hands and feet, lips and tongue.  He is been evaluated by neurology and has been started on gabapentin for his peripheral neuropathy.  He has been on a prednisone tapering dose and as of today, his dose was just reduced to 30 mg daily for the next week.  He is seeing Dr. Lavell Anchors for his neuropathy.  He tells me he was in [facility] for several months.  He denies recurrent anginal symptoms.  He presents for cardiology reevaluation.   Past Medical History  Diagnosis Date  . Hyperlipidemia   . Hypertension   . Stented coronary artery   . Coronary artery disease 01/16/11    nuclear stress test- low risk scan. no ischemia or infarct/scar is seen EF 77%  . Bell's palsy   . Ataxia 03/09/2015  . Loss of touch sensation on examination 03/09/2015    Past Surgical History  Procedure Laterality Date  . Coronary angioplasty      No Known Allergies  Current Outpatient Prescriptions  Medication Sig Dispense Refill  . aspirin 325 MG tablet Take 325 mg by mouth daily.    [name] atorvastatin (LIPITOR)  40 MG tablet Take 1 tablet (40 mg total) by mouth daily. *MUST KEEP APPOINTMENT* 30 tablet 1  . B Complex Vitamins (B COMPLEX-B12 PO) Take by mouth.    . fish oil-omega-3 fatty acids 1000 MG capsule Take 2 g by mouth daily.    [name] gabapentin (NEURONTIN) 300 MG capsule Take 300 mg by mouth at bedtime.    . metoprolol succinate (TOPROL-XL) 25 MG 24 hr tablet Take 1 tablet (25 mg total) by mouth daily. 90 tablet 3  . Multiple Vitamin (MULTIVITAMIN WITH MINERALS) TABS tablet Take 1 tablet by mouth daily.    . predniSONE (DELTASONE) 10 MG tablet As directed (Patient taking differently: 30 mg. As directed) 180 tablet 0   No current facility-administered medications for this visit.    History   Social History  . Marital Status: Married    Spouse Name: N/A  . Number of Children: 2  . Years of Education: 12   Occupational History  . Media planner- unemployed currently     Social History Main Topics  . Smoking status: Former Smoker -- 0.10 packs/day    Types: Cigarettes    Quit date: 02/15/2007  . Smokeless tobacco: Not on file  . Alcohol Use: No  . Drug Use: No  . Sexual Activity: Not on file   Other  Topics Concern  . Not on file   Social History Narrative   2-3 cups of caffeine daily   Socially he is married and has 2 children. 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  Family History  Problem Relation Age of Onset  . Heart attack Mother    ROS General: Negative; No fevers, chills, or night sweats;  HEENT: Negative; No changes in vision or hearing, sinus congestion, difficulty swallowing Pulmonary: Negative; No cough, wheezing, shortness of breath, hemoptysis Cardiovascular: Negative; No chest pain, presyncope, syncope, palpitations GI: Negative; No nausea, vomiting, diarrhea, or abdominal pain GU: Negative; No dysuria, hematuria, or difficulty voiding Musculoskeletal: Negative; no myalgias, joint pain, or  weakness Hematologic/Oncology: Negative; no easy bruising, bleeding Endocrine: Negative; no heat/cold intolerance; no diabetes Neuro: Recent Bell's palsy.  Recent development of peripheral neuropathy. Skin: Negative; No rashes or skin lesions Psychiatric: Negative; No behavioral problems, depression Sleep: Negative; No snoring, daytime sleepiness, hypersomnolence, bruxism, restless legs, hypnogognic hallucinations, no cataplexy Other comprehensive 14 point system review is negative.  PE BP 122/74 mmHg  Pulse 57  Ht _0  (1.753 m)  Wt 195 lb 9.6 oz (88.724 kg)  BMI 28.87 kg/m2   Wt Readings from Last 3 Encounters:  04/25/15 195 lb 9.6 oz (88.724 kg)  03/28/15 186 lb 6.4 oz (84.55 kg)  03/27/15 185 lb (83.915 kg)   General: Alert, oriented, no distress.  Skin: normal turgor, no rashes HEENT: Normocephalic, atraumatic. Pupils round and reactive; sclera anicteric;no lid lag.  Nose without nasal septal hypertrophy Mouth/Parynx benign; Mallinpatti scale 2 Neck: No JVD, no carotid bruits with normal carotid upstroke Lungs: clear to ausculatation and percussion; no wheezing or rales Chest wall: Nontender to palpation Heart: RRR, s1 s2 normal 1/6 sem Abdomen: soft, nontender; no hepatosplenomehaly, BS+; abdominal aorta nontender and not dilated by palpation. Back: No CVA tenderness Pulses 2+ Extremities: no clubbing cyanosis or edema, Homan's sign negative  Neurologic: grossly nonfocal Psychologic: normal affect and mood.  ECG (independently read by me): Sinus bradycardia with sinus arrhythmia at 57 bpm.  Normal intervals.  No ST segment changes.  October 2014 ECG:  Sinus rhythm at 59 beats per minute. Nonspecific T changes.  LABS: BMP Latest Ref Rng 03/28/2015 02/01/2015 08/23/2013  Glucose 65 - 99 mg/dL 94 103(H) 94  BUN 8 - 27 mg/dL _1 Creatinine 0.76 - 1.27 mg/dL 1.04 1.13 1.14  BUN/Creat Ratio 10 - 22 19 - -  Sodium 134 - 144 mmol/L 140 139 138  Potassium 3.5 - 5.2  mmol/L 4.3 4.6 4.2  Chloride 97 - 108 mmol/L 103 109 107  CO2 18 - 29 mmol/L _2 Calcium 8.6 - 10.2 mg/dL 9.4 9.3 8.9     Hepatic Function Latest Ref Rng 02/01/2015 08/23/2013  Total Protein 6.0 - 8.3 g/dL 6.9 6.6  Albumin 3.5 - 5.2 g/dL 3.9 4.3  AST 0 - 37 U/L 28 23  ALT 0 - 53 U/L 26 30  Alk Phosphatase 39 - 117 U/L 72 66  Total Bilirubin 0.3 - 1.2 mg/dL 1.1 0.7   CBC Latest Ref Rng 03/07/2015 02/08/2015 02/01/2015  WBC 4.6 - 10.2 K/uL 8.0 13.1(A) 6.7  Hemoglobin 14.1 - 18.1 g/dL 16.0 17.1 16.2  Hematocrit 43.5 - 53.7 % 47.4 51.1 46.6  Platelets 150 - 400 K/uL - - 147(L)   Lab Results  Component Value Date   MCV 89.6 03/07/2015   MCV 88.0  02/08/2015   MCV 88.8 02/01/2015   Lab Results  Component Value Date   HGBA1C 5.4 03/28/2015   Lipid Panel     Component Value Date/Time   CHOL 141 08/23/2013 0935   TRIG 212* 08/23/2013 0935   HDL 30* 08/23/2013 0935   CHOLHDL 4.7 08/23/2013 0935   VLDL 42* 08/23/2013 0935   LDLCALC 69 08/23/2013 0935   RADIOLOGY: No results found.    ASSESSMENT AND PLAN: Mr. Parisien is a 69 year old gentleman who is now 17 years status post PTCA and stenting of his RCA. His last nuclear study in 2014 continued to show normal perfusion.  His blood pressure today is stable on metoprolol XL 25 mg.  He is on Lipitor 40 mg.  In 2014.  His LDL was at target at 69, but he has not had recent laboratory.  He has developed a peripheral neuropathy of unknown etiology.  He is on gabapentin.  He also had developed recent Bell's palsy and is on a tapering dose of prednisone.  He has undergone fairly extensive laboratory.  Both by Dr. Roddie Mc, as well as Dr. Lavell Anchors from a neurologic standpoint.  I am recommending we recheck a competent metabolic panel, lipid panel and CBC.  He's not having recurrent anginal symptoms.  He is not having GERD.  I have recommended he reduce his aspirin to 81 mg.  Adjustments to his medical therapy will be made if necessary.  I will see  him in one year for reevaluation.  Time spent: 25 minutes Troy Sine, MD, [facility]  04/26/2015 5:30 PM

## 2015-05-02 ENCOUNTER — Other Ambulatory Visit: Payer: Self-pay | Admitting: Neurology

## 2015-05-02 ENCOUNTER — Ambulatory Visit (INDEPENDENT_AMBULATORY_CARE_PROVIDER_SITE_OTHER): Payer: Medicare Other | Admitting: Neurology

## 2015-05-02 ENCOUNTER — Telehealth: Payer: Self-pay | Admitting: Neurology

## 2015-05-02 ENCOUNTER — Encounter: Payer: Self-pay | Admitting: Neurology

## 2015-05-02 VITALS — BP 108/61 | HR 69 | Temp 97.6°F | Ht 69.0 in | Wt 197.4 lb

## 2015-05-02 DIAGNOSIS — G6181 Chronic inflammatory demyelinating polyneuritis: Secondary | ICD-10-CM

## 2015-05-02 NOTE — Telephone Encounter (Signed)
Kara Meadmma -can you please create a letter that states patient can go back to work on June 18th without restriction. We will fax to [employer] [phone],  Address to Paggy May. Please call patient when this is complete. Thank you

## 2015-05-02 NOTE — Progress Notes (Signed)
[facility]    CC: neuropathy  Interval history 05/02/2015:: Patrick Price is a 69 y.o. male here as a referral from Dr. Perrin MalteseGuest for CIDP. He is on 20mg  steroids. Improved but still with symptoms. He is having side effects, gaining weight, isomnia.   MRI of the lumbar spine with and without contrast: Fairly homogenous enhancement of the nerve roots in the cauda equina without definite enlargement. In the right clinical setting, this could be consistent with CIDP.  History: In March 31st of this year he had Bell's Palsy and then he started noticing the sensation in his feet and hands. Feels like rocks in the shoes, tightening on the toes, symmetric bilterally.Fingers are numb, like somebody else's fingers. Toes are very numb. Very cold. Never had anything similar to this. Ataxic and falling, can't hol don to tools and drops them, is scared to be on a tall ladder at work. The symptoms are better in the morning but they are continuous all day. Symptoms started then progressed, unclear possibly stabilized at this point or maybe very slowly progressive. Balance has been poor. He has been stumbling lately. No cramping. Just numb, and paresthesias. Hurting last evening in bed. No low back pain. No radicular symptoms. He reports a 30 pound unexplained weight loss since symptoms started. He reports distal weakness and ataxia.  Reviewed notes, labs and imaging from outside physicians, which showed: LP glucose was 60, high protein 184 with low cells, Lyme negative  Serum studies include normal rheumatoid factor, CRP, sedimentation rate, B12, ANA, TSH, IFE  Personal reviewed the following images and agree with findings  MRI c-spine: This MRI of the cervical spine with and without contrast shows the following: 1. Degenerative changes at C4-C5, C5-C6 and C6-C7 as detailed above. C6-C7 there is moderate left foraminal narrowing associated with some encroachment on the exiting left C7 nerve  root. 2. The spinal cord appears normal. 3. There is a normal enhancement pattern. There are no acute findings.  MRI thoracic spine: This MRI of the thoracic spine with and without contrast shows the following: 1. Small right paramedian disc protrusion at T6-T7, Small left paramedian disc protrusion at T8-T9 and small left paramedian disc bulge at T10-T11. These do not lead to any nerve root impingement. 2. The spinal cord appears normal throughout the imaged thoracic course.  3. There is a normal enhancement pattern. There are no acute findings   EMG results showed moderate sensory motor axonal neuropathy, length dependent  Review of Systems: Patient complains of symptoms per HPI as well as the following symptoms: Weight loss, feeling cold, weakness, numbness, restless legs, decreased energy, change in appetite. Pertinent negatives per HPI. All others negative.  Review of Systems: Patient complains of symptoms per HPI as well as the following symptoms: . P appetite change, weight change, excessive eating, frequency of urination, numbness, daytime sleepiness ertinent negatives per HPI. All others negative.   History   Social History  . Marital Status: Married    Spouse Name: N/A  . Number of Children: 2  . Years of Education: 12   Occupational History  . Research officer, trade unionAircraft inspector- unemployed currently     Social History Main Topics  . Smoking status: Former Smoker -- 0.10 packs/day    Types: Cigarettes    Quit date: 02/15/2007  . Smokeless tobacco: Not on file  . Alcohol Use: No  . Drug Use: No  . Sexual Activity: Not on file   Other Topics Concern  . Not on  file   Social History Narrative   2-3 cups of caffeine daily    Family History  Problem Relation Age of Onset  . Heart attack Mother     Past Medical History  Diagnosis Date  . Hyperlipidemia   . Hypertension   . Stented coronary artery   . Coronary artery disease 01/16/11    nuclear stress test- low  risk scan. no ischemia or infarct/scar is seen EF 77%  . Bell's palsy   . Ataxia 03/09/2015  . Loss of touch sensation on examination 03/09/2015    Past Surgical History  Procedure Laterality Date  . Coronary angioplasty      Current Outpatient Prescriptions  Medication Sig Dispense Refill  . aspirin 81 MG tablet Take 81 mg by mouth daily.    [name] atorvastatin (LIPITOR) 40 MG tablet Take 1 tablet (40 mg total) by mouth daily. *MUST KEEP APPOINTMENT* 30 tablet 1  . B Complex Vitamins (B COMPLEX-B12 PO) Take by mouth.    . fish oil-omega-3 fatty acids 1000 MG capsule Take 2 g by mouth daily.    [name] gabapentin (NEURONTIN) 300 MG capsule Take 300 mg by mouth at bedtime.    . metoprolol succinate (TOPROL-XL) 25 MG 24 hr tablet Take 1 tablet (25 mg total) by mouth daily. 90 tablet 3  . Multiple Vitamin (MULTIVITAMIN WITH MINERALS) TABS tablet Take 1 tablet by mouth daily.    . predniSONE (DELTASONE) 10 MG tablet As directed (Patient taking differently: 20 mg. As directed) 180 tablet 0   No current facility-administered medications for this visit.    Allergies as of 05/02/2015  . (No Known Allergies)    Vitals: BP 108/61 mmHg  Pulse 69  Temp(Src) 97.6 F (36.4 C) (Oral)  Ht 5\' 9"  (1.753 m)  Wt 197 lb 6.4 oz (89.54 kg)  BMI 29.14 kg/m2 Last Weight:  Wt Readings from Last 1 Encounters:  05/02/15 197 lb 6.4 oz (89.54 kg)   Last Height:   Ht Readings from Last 1 Encounters:  05/02/15 5\' 9"  (1.753 m)    Neuro: Detailed Neurologic Exam  Speech:  Speech is normal; fluent and spontaneous with normal comprehension.  Cognition:  The patient is oriented to person, place, and time;   recent and remote memory intact;   language fluent;   normal attention, concentration,   fund of knowledge Cranial Nerves:  The pupils are equal, round, and reactive to light. Visual fields are full to finger confrontation. Extraocular movements are intact. Trigeminal sensation is intact  and the muscles of mastication are normal. The face is symmetric. The palate elevates in the midline. Hearing intact. Voice is normal. Shoulder shrug is normal. The tongue has normal motion without fasciculations.   Coordination:  No dysmetria  Gait:  No ataxia noted  Motor Observation:  No asymmetry, no atrophy, and no involuntary movements noted. Tone:  Normal muscle tone.   Posture:  Posture is normal. normal erect   Strength: Mild distal weakness otherwise strength is V/V in the upper and lower limbs.    Sensation: Decreased pinprick, temperature, vibration distally in the lower extremities. Mildly impaired proprioception distally in the lower extremities. Positive Romberg. Improved since last examination.   Reflex Exam:  DTR's: Absent Achilles, brisk patellars   Toes:  The toes are downgoing bilaterally.  Clonus:  Clonus is absent.       Assessment/Plan: This is a 69 year old patient with a past medical history of hyperlipidemia, hypertension, coronary artery disease status post stenting who  presented with acute onset distal ascending sensory changes,ataxia, distal weakness along with a Bell's palsy. Lumbar puncture showed albuminocytologic dissociation with very high protein of 184, MRI of the cervical and thoracic spines were unremarkable. MRI of the lumbar spine showed Fairly homogenous enhancement of the nerve roots in the cauda equina without definite enlargement consistent with CIDP.  Patient is improving on steroids and he is not tolerating them. We'll initiate IVIG. Discussed at length the side effects of IVIG which can include serious side effects including stroke, renal failure, aseptic meningitis. We'll initiate each 3 weeks for 2-3 months and follow clinical course.  Patient cell phone is [phone]  Naomie Dean, MD  [facility] [address] [place], [place] [address]  Phone [phone]  Fax [phone]  A total of 30 minutes was spent face-to-face with this patient. Over half this time was spent on counseling patient on the CIDP diagnosis and different diagnostic and therapeutic options available.

## 2015-05-03 ENCOUNTER — Encounter: Payer: Self-pay | Admitting: *Deleted

## 2015-05-03 LAB — COMPREHENSIVE METABOLIC PANEL
ALT: 36 IU/L (ref 0–44)
AST: 17 IU/L (ref 0–40)
Albumin/Globulin Ratio: 1.9 (ref 1.1–2.5)
Albumin: 4.4 g/dL (ref 3.6–4.8)
Alkaline Phosphatase: 63 IU/L (ref 39–117)
BUN/Creatinine Ratio: 17 (ref 10–22)
BUN: 24 mg/dL (ref 8–27)
Bilirubin Total: 0.5 mg/dL (ref 0.0–1.2)
CO2: 24 mmol/L (ref 18–29)
Calcium: 9.6 mg/dL (ref 8.6–10.2)
Chloride: 105 mmol/L (ref 97–108)
Creatinine, Ser: 1.44 mg/dL — ABNORMAL HIGH (ref 0.76–1.27)
GFR calc Af Amer: 57 mL/min/{1.73_m2} — ABNORMAL LOW (ref >59)
GFR calc non Af Amer: 50 mL/min/{1.73_m2} — ABNORMAL LOW (ref >59)
Globulin, Total: 2.3 g/dL (ref 1.5–4.5)
Glucose: 111 mg/dL — ABNORMAL HIGH (ref 65–99)
Potassium: 5 mmol/L (ref 3.5–5.2)
Sodium: 148 mmol/L — ABNORMAL HIGH (ref 134–144)
Total Protein: 6.7 g/dL (ref 6.0–8.5)

## 2015-05-03 LAB — CBC
Hematocrit: 50.2 % (ref 37.5–51.0)
Hemoglobin: 16.4 g/dL (ref 12.6–17.7)
MCH: 30.3 pg (ref 26.6–33.0)
MCHC: 32.7 g/dL (ref 31.5–35.7)
MCV: 93 fL (ref 79–97)
Platelets: 128 10*3/uL — ABNORMAL LOW (ref 150–379)
RBC: 5.41 x10E6/uL (ref 4.14–5.80)
RDW: 15.1 % (ref 12.3–15.4)
WBC: 10.5 10*3/uL (ref 3.4–10.8)

## 2015-05-03 LAB — IGG, IGA, IGM
IgA/Immunoglobulin A, Serum: 166 mg/dL (ref 61–437)
IgG (Immunoglobin G), Serum: 705 mg/dL (ref 700–1600)
IgM (Immunoglobulin M), Srm: 71 mg/dL (ref 20–172)

## 2015-05-03 NOTE — Progress Notes (Signed)
Faxed return back to work letter to Paggy May to [phone] at 4:45pm. Received fax confirmation. Called pt to let him know. Pt verbalized understanding.

## 2015-05-03 NOTE — Telephone Encounter (Signed)
Faxed return to work note from you. Pt called and is aware, thank you!

## 2015-05-03 NOTE — Telephone Encounter (Signed)
Thank you :)

## 2015-05-04 ENCOUNTER — Other Ambulatory Visit: Payer: Self-pay | Admitting: Neurology

## 2015-05-04 ENCOUNTER — Telehealth: Payer: Self-pay | Admitting: Neurology

## 2015-05-04 DIAGNOSIS — N179 Acute kidney failure, unspecified: Secondary | ICD-10-CM

## 2015-05-04 NOTE — Telephone Encounter (Signed)
Creatinine increased. Aske dhim to decrease prednisone to  a day. IVIG orders placed. Increase fkuids and come back for repeat bmp next week.

## 2015-05-07 ENCOUNTER — Other Ambulatory Visit: Payer: Self-pay | Admitting: Cardiovascular Disease

## 2015-05-08 NOTE — Telephone Encounter (Signed)
REFILL 

## 2015-05-16 ENCOUNTER — Telehealth: Payer: Self-pay | Admitting: Neurology

## 2015-05-16 NOTE — Telephone Encounter (Signed)
Spoke to patient. IVIG would cost 4100 dollars which is cost prohibitive. But he is feeling much better on the steroids. The feeling in his hands and feet are significantly better, so much so that he is back to work. We have titrated down to  a day of prednisone and now having no side effect. Will keep him at this dose until his appointment next month then will titrate him lower. Advised him to supplement with calcium and vitamin D, watch his blood pressure and watch for other side effects such as glucose, mania. Call for anything significant.

## 2015-05-16 NOTE — Telephone Encounter (Signed)
[facility] [place] IV infusion for the patient. Jude needs additional clinical date.Thank you.

## 2015-05-23 NOTE — Telephone Encounter (Signed)
Per Dr. Lucia GaskinsAhern, patient has declined IVIG for now.

## 2015-06-05 ENCOUNTER — Encounter: Payer: Self-pay | Admitting: Cardiovascular Disease

## 2015-06-28 ENCOUNTER — Ambulatory Visit: Payer: Self-pay | Admitting: Neurology

## 2015-07-02 ENCOUNTER — Ambulatory Visit: Payer: Medicare Other | Admitting: Neurology

## 2015-09-11 ENCOUNTER — Telehealth: Payer: Self-pay | Admitting: Family Medicine

## 2015-09-11 NOTE — Telephone Encounter (Signed)
Spoke with patient and he is coming in on Friday November 18 at 8:30 to have a CPE with Deboraha Sprangebbie Gessner.

## 2015-09-21 ENCOUNTER — Encounter: Payer: Self-pay | Admitting: Family Medicine

## 2015-10-15 ENCOUNTER — Other Ambulatory Visit: Payer: Self-pay | Admitting: Cardiovascular Disease

## 2015-10-15 NOTE — Telephone Encounter (Signed)
REFILL 

## 2015-12-12 IMAGING — CR DG CHEST 2V
2 series · 2 of 2 positions shown · non-contrast
Comparison: None.

CLINICAL DATA: Recent unexplained weight loss. Numbness in the
extremities. Generalized weakness.

EXAM:
CHEST  2 VIEW

[PA]
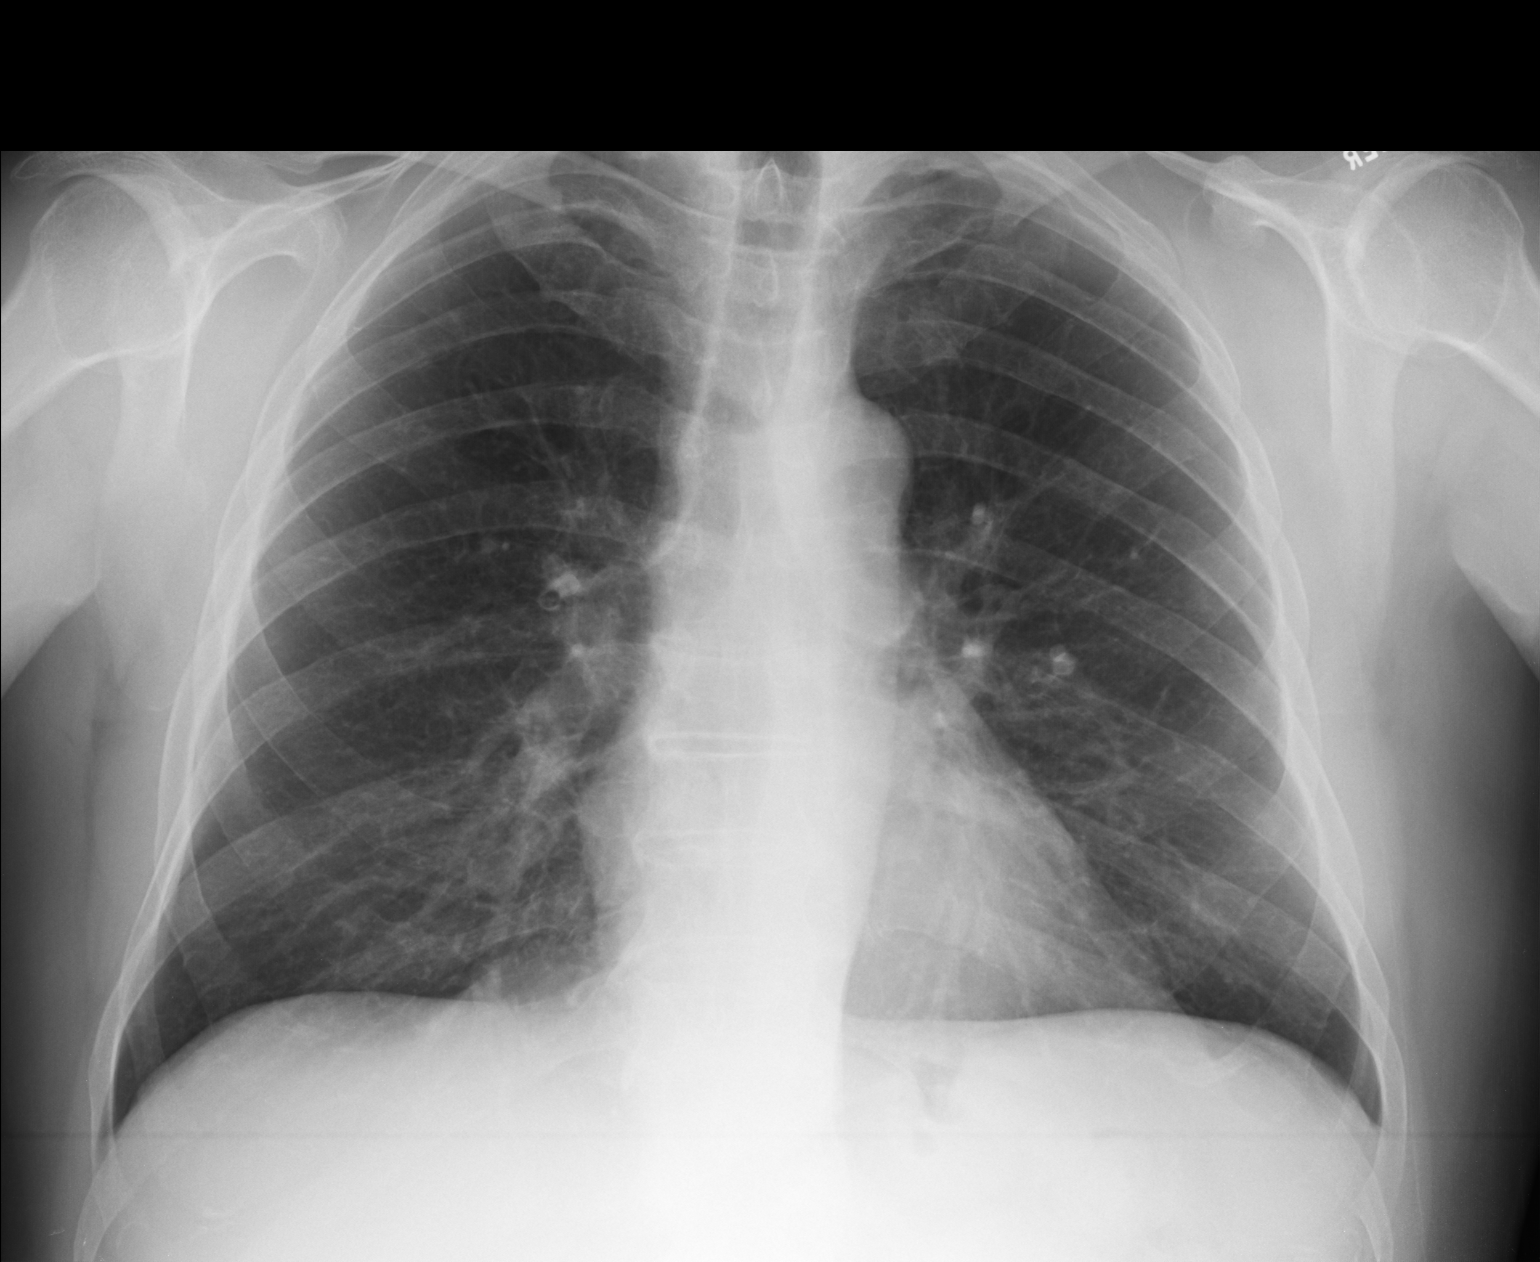

[lateral]
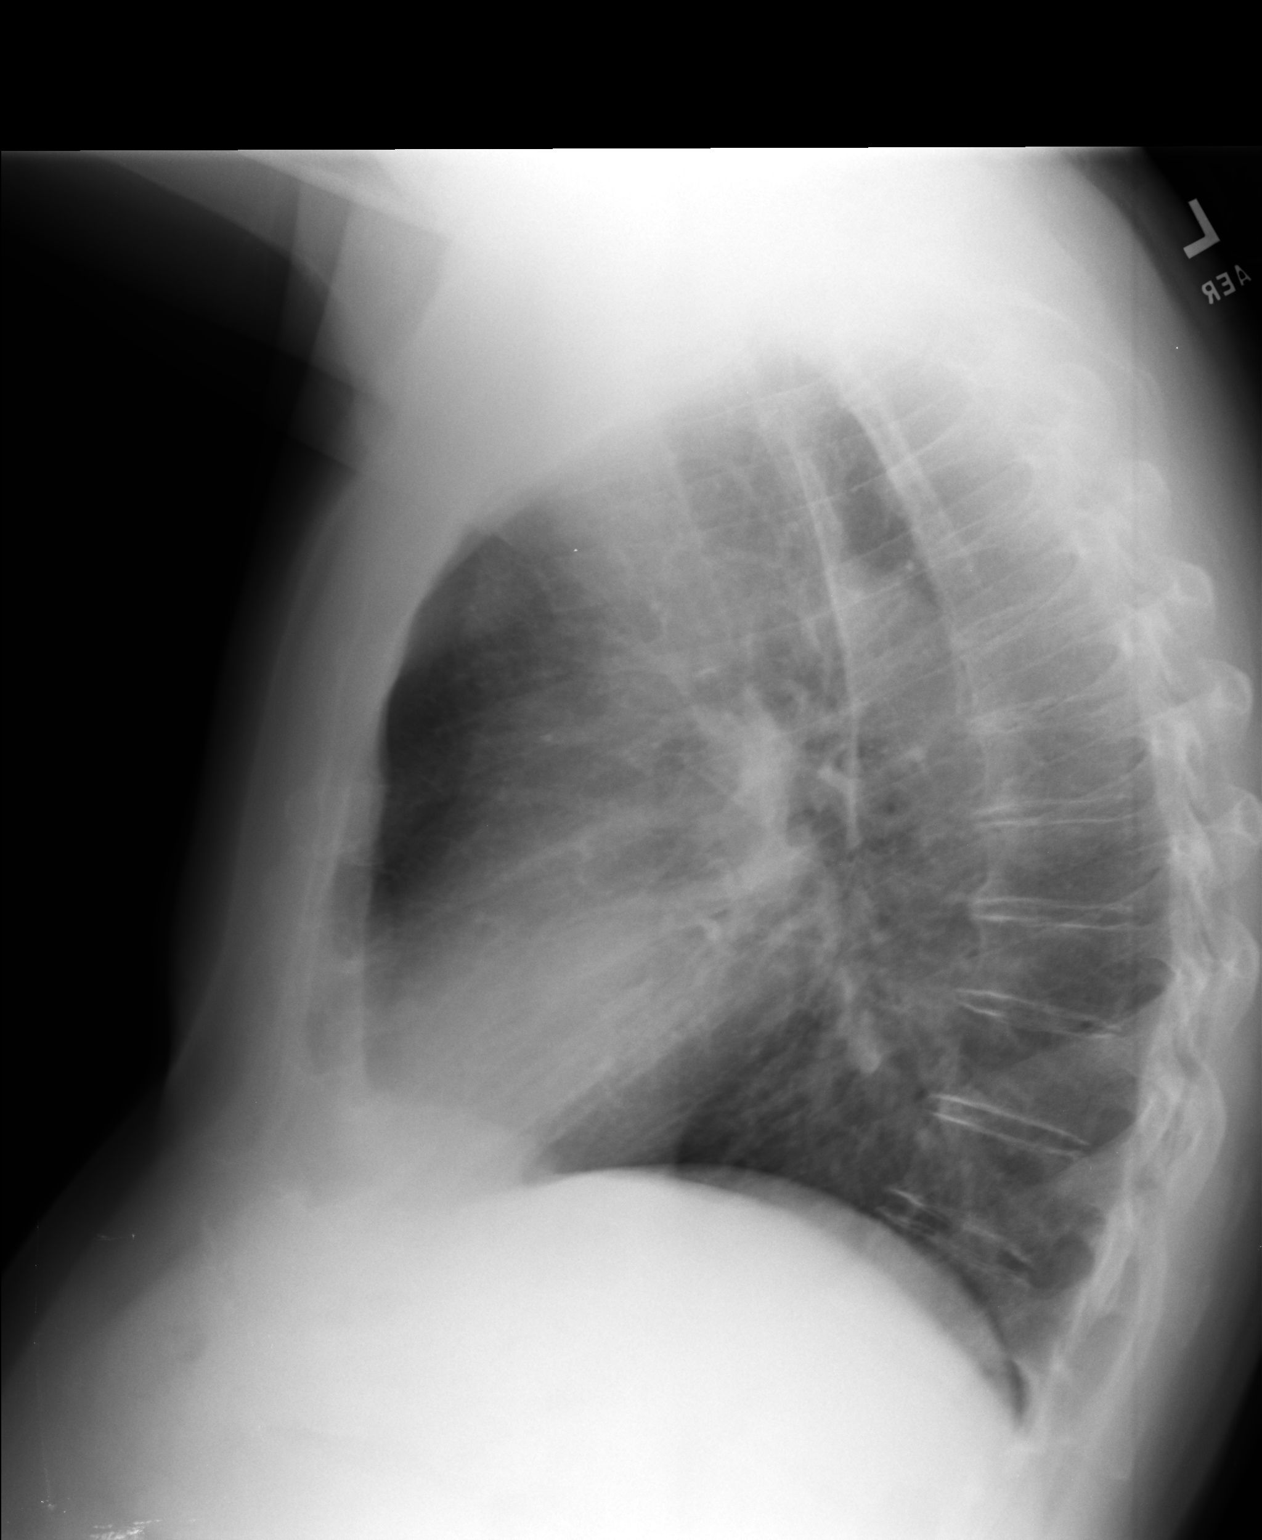

[2 of 2 positions shown; findings below may reference images not displayed]

FINDINGS: Cardiomediastinal silhouette unremarkable. Lungs clear.
Bronchovascular markings normal. Pulmonary vascularity normal. No
visible pleural effusions. No pneumothorax. Mild degenerative
changes involving the thoracic spine.
IMPRESSION: No acute cardiopulmonary disease.

## 2015-12-17 ENCOUNTER — Encounter: Payer: Self-pay | Admitting: Internal Medicine

## 2016-01-03 IMAGING — XA DG FLUORO GUIDE LUMBAR PUNCTURE
1 series · 1 of 1 positions shown · non-contrast
Comparison: none

CLINICAL DATA: Ataxia. Loss of touch sensation. Possible
Guillain-Barre syndrome.

[Series 1: ortho adipose · 1 of 1 slices shown]
[im 1/1]
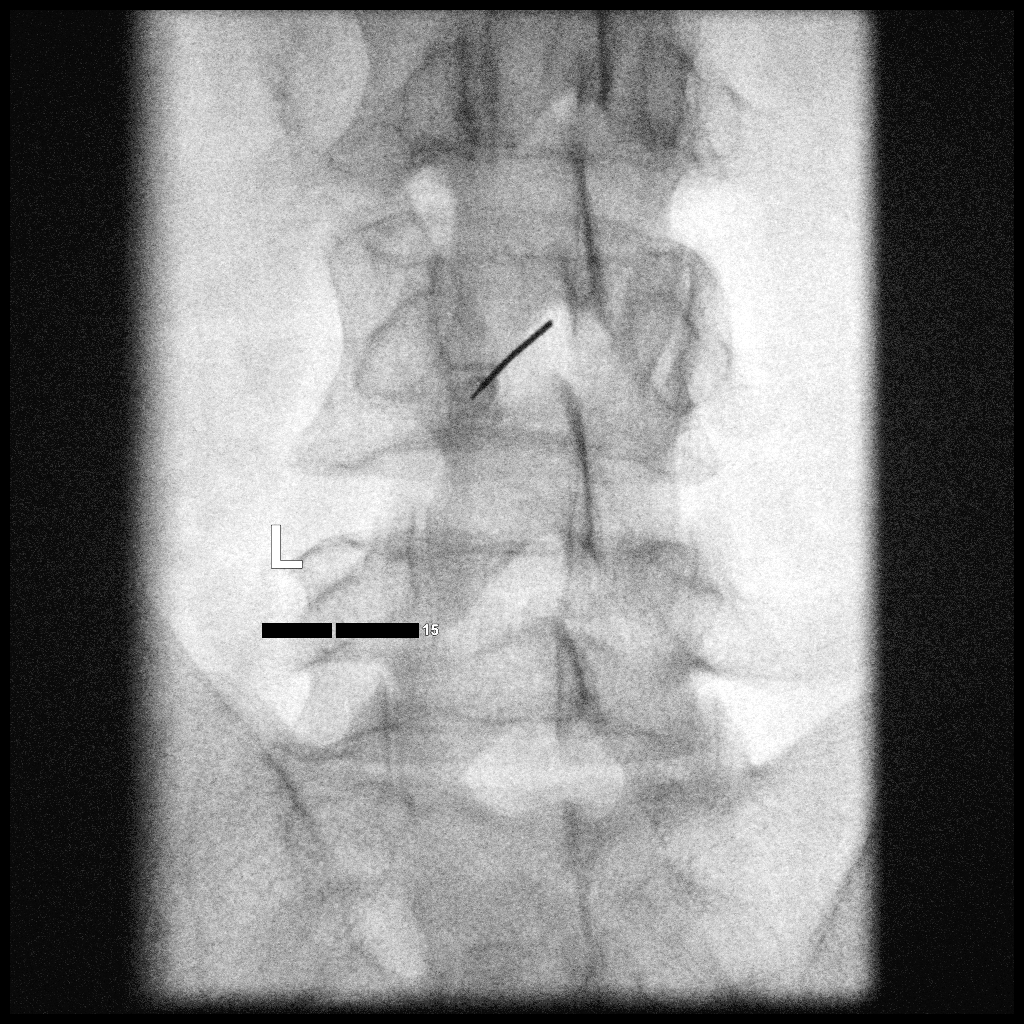

[1 of 1 positions shown; findings below may reference images not displayed]

EXAM:
DIAGNOSTIC LUMBAR PUNCTURE UNDER FLUOROSCOPIC GUIDANCE

FLUOROSCOPY TIME:  Radiation Exposure Index (as provided by the
fluoroscopic device): 18.38 microGray*m2

PROCEDURE:
Informed consent was obtained from the patient prior to the
procedure, including potential complications of headache, allergy,
and pain. With the patient prone, the lower back was prepped with
Betadine. 1% Lidocaine was used for local anesthesia. Lumbar
puncture was performed at the L3-4 level using a 3.5 inch, 20 gauge
needle via a left paramedian approach with return of clear CSF with
an opening pressure of 15 cm water (measured in the left lateral
decubitus position). 12.5 ml of CSF were obtained for laboratory
studies. The patient tolerated the procedure well and there were no
apparent complications.
IMPRESSION: Successful fluoroscopic guided lumbar puncture.

## 2016-06-15 ENCOUNTER — Other Ambulatory Visit: Payer: Self-pay | Admitting: Cardiovascular Disease

## 2017-03-10 ENCOUNTER — Other Ambulatory Visit: Payer: Self-pay | Admitting: Cardiovascular Disease

## 2017-04-06 ENCOUNTER — Other Ambulatory Visit: Payer: Self-pay | Admitting: Cardiovascular Disease

## 2017-04-06 NOTE — Telephone Encounter (Signed)
REFILL 

## 2017-04-19 ENCOUNTER — Other Ambulatory Visit: Payer: Self-pay | Admitting: Cardiovascular Disease

## 2017-05-02 ENCOUNTER — Other Ambulatory Visit: Payer: Self-pay | Admitting: Cardiovascular Disease

## 2017-08-05 ENCOUNTER — Ambulatory Visit (INDEPENDENT_AMBULATORY_CARE_PROVIDER_SITE_OTHER): Payer: Medicare Other | Admitting: Cardiovascular Disease

## 2017-08-05 ENCOUNTER — Encounter: Payer: Self-pay | Admitting: Cardiovascular Disease

## 2017-08-05 VITALS — BP 128/74 | HR 54 | Ht 69.0 in | Wt 182.6 lb

## 2017-08-05 DIAGNOSIS — E785 Hyperlipidemia, unspecified: Secondary | ICD-10-CM | POA: Diagnosis not present

## 2017-08-05 DIAGNOSIS — Z8669 Personal history of other diseases of the nervous system and sense organs: Secondary | ICD-10-CM

## 2017-08-05 DIAGNOSIS — Z79899 Other long term (current) drug therapy: Secondary | ICD-10-CM

## 2017-08-05 DIAGNOSIS — I2510 Atherosclerotic heart disease of native coronary artery without angina pectoris: Secondary | ICD-10-CM | POA: Diagnosis not present

## 2017-08-05 NOTE — Progress Notes (Signed)
Patient ID: Patrick Price, male   DOB: January 29, 1946, 71 y.o.   MRN: [id number]     HPI: Patrick Price is a 71 y.o. male who presents for and a 32 month cardiology evaluation.  [name] has established CAD underwent PTCA and stenting of his RCA in 1999. He has a long-standing history of prior tobacco use but quit smoking  when he turns 60. He does have a history of hyperlipidemia. A two-year followup nuclear perfusion study in May 2014  continued to demonstrate normal perfusion without scar or ischemia on his medical therapy.  He has a history of hyperlipidemia and had been on atorvastatin 40 mg daily in addition to fish oil.  He has been on Toprol-XL 25 mg daily.  In March 2016 he developed Bell's palsy.  Ultimately this improved.  However, 2 weeks later he developed some numbness of his hands and feet, lips and tongue.  He was evaluated by neurology and was started on gabapentin for his peripheral neuropathy.  When I last saw me was on a tapering steroid dose.  Over the past 2 years, he states he has felt well from a cardiac standpoint.  He has lost over 30 pounds.  He exercises regularly.  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  Inspection.  He has stopped all his medications for the past year and a half.  He states he has felt well off the medications.  He no longer has peripheral neuropathy symptoms.  He denies any episodes of chest tightness.  He denies shortness of breath.  He walks his dog at least a mile several days per week.  He presents for reevaluation.  Past Medical History:  Diagnosis Date  . Ataxia 03/09/2015  . Bell's palsy   . Coronary artery disease 01/16/11   nuclear stress test- low risk scan. no ischemia or infarct/scar is seen EF 77%  . Hyperlipidemia   . Hypertension   . Loss of touch sensation on examination 03/09/2015  . Stented coronary artery     Past Surgical History:  Procedure Laterality Date  . CORONARY ANGIOPLASTY      No Known Allergies  No  current outpatient prescriptions on file.   No current facility-administered medications for this visit.     Social History   Social History  . Marital status: Married    Spouse name: N/A  . Number of children: 2  . Years of education: 12   Occupational History  . Media planner- unemployed currently     Social History Main Topics  . Smoking status: Former Smoker    Packs/day: 0.10    Types: Cigarettes    Quit date: 02/15/2007  . Smokeless tobacco: Never Used  . Alcohol use No  . Drug use: No  . Sexual activity: Not on file   Other Topics Concern  . Not on file   Social History Narrative   2-3 cups of caffeine daily   Socially he is married and has 2 children.  Family History  Problem Relation Age of Onset  . Heart attack Mother    ROS General: Negative; No fevers, chills, or night sweats;  HEENT: Negative; No changes in vision or hearing, sinus congestion, difficulty swallowing Pulmonary: Negative; No cough, wheezing, shortness of breath, hemoptysis Cardiovascular: Negative; No chest pain, presyncope, syncope, palpitations GI: Negative; No nausea, vomiting, diarrhea, or abdominal pain GU: Negative; No dysuria, hematuria, or difficulty voiding Musculoskeletal: Negative; no myalgias, joint pain, or weakness Hematologic/Oncology:  Negative; no easy bruising, bleeding Endocrine: Negative; no heat/cold intolerance; no diabetes Neuro: Recent Bell's palsy.  Recent development of peripheral neuropathy. Skin: Negative; No rashes or skin lesions Psychiatric: Negative; No behavioral problems, depression Sleep: Negative; No snoring, daytime sleepiness, hypersomnolence, bruxism, restless legs, hypnogognic hallucinations, no cataplexy Other comprehensive 14 point system review is negative.  PE BP 128/74   Pulse (!) 54   Ht _0  (1.753 m)   Wt 182 lb 9.6 oz (82.8 kg)   BMI 26.97 kg/m    Repeat blood pressure by me was 120/70.  Wt Readings from Last 3 Encounters:    08/05/17 182 lb 9.6 oz (82.8 kg)  05/02/15 197 lb 6.4 oz (89.5 kg)  04/25/15 195 lb 9.6 oz (88.7 kg)   General: Alert, oriented, no distress.  Skin: normal turgor, no rashes, warm and dry HEENT: Normocephalic, atraumatic. Pupils equal round and reactive to light; sclera anicteric; extraocular muscles intact;  Nose without nasal septal hypertrophy Mouth/Parynx benign; Mallinpatti scale 2 Neck: No JVD, no carotid bruits; normal carotid upstroke Lungs: clear to ausculatation and percussion; no wheezing or rales Chest wall: without tenderness to palpitation Heart: PMI not displaced, RRR, s1 s2 normal, 1/6 systolic murmur, no diastolic murmur, no rubs, gallops, thrills, or heaves Abdomen: soft, nontender; no hepatosplenomehaly, BS+; abdominal aorta nontender and not dilated by palpation. Back: no CVA tenderness Pulses 2+ Musculoskeletal: full range of motion, normal strength, no joint deformities Extremities: no clubbing cyanosis or edema, Homan's sign negative  Neurologic: grossly nonfocal; Cranial nerves grossly wnl Psychologic: Normal mood and affect  ECG (independently read by me): Sinus bradycardia 54 bpm.  PR interval 22 ms.  No ST segment changes.  June 2016 ECG (independently read by me): Sinus bradycardia with sinus arrhythmia at 57 bpm.  Normal intervals.  No ST segment changes.  October 2014 ECG:  Sinus rhythm at 59 beats per minute. Nonspecific T changes.  LABS: BMP Latest Ref Rng & Units 05/02/2015 03/28/2015 02/01/2015  Glucose 65 - 99 mg/dL 111(H) 94 103(H)  BUN 8 - 27 mg/dL _1 Creatinine 0.76 - 1.27 mg/dL 1.44(H) 1.04 1.13  BUN/Creat Ratio 10 - _2 -  Sodium 134 - 144 mmol/L 148(H) 140 139  Potassium 3.5 - 5.2 mmol/L 5.0 4.3 4.6  Chloride 97 - 108 mmol/L 105 103 109  CO2 18 - 29 mmol/L _3 Calcium 8.6 - 10.2 mg/dL 9.6 9.4 9.3     Hepatic Function Latest Ref Rng & Units 05/02/2015 02/01/2015 08/23/2013  Total Protein 6.0 - 8.5 g/dL 6.7 6.9 6.6   Albumin 3.6 - 4.8 g/dL 4.4 3.9 4.3  AST 0 - 40 IU/L _4 ALT 0 - 44 IU/L 36 26 30  Alk Phosphatase 39 - 117 IU/L 63 72 66  Total Bilirubin 0.0 - 1.2 mg/dL 0.5 1.1 0.7   CBC Latest Ref Rng & Units 05/02/2015 03/07/2015 02/08/2015  WBC 3.4 - 10.8 x10E3/uL 10.5 8.0 13.1(A)  Hemoglobin 12.6 - 17.7 g/dL 16.4 16.0 17.1  Hematocrit 37.5 - 51.0 % 50.2 47.4 51.1  Platelets 150 - 379 x10E3/uL 128(L) - -   Lab Results  Component Value Date   MCV 93 05/02/2015   MCV 89.6 03/07/2015   MCV 88.0 02/08/2015   Lab Results  Component Value Date   HGBA1C 5.4 03/28/2015   Lipid Panel     Component Value Date/Time   CHOL 141 08/23/2013 0935   TRIG 212 (H) 08/23/2013 0935  HDL 30 (L) 08/23/2013 0935   CHOLHDL 4.7 08/23/2013 0935   VLDL 42 (H) 08/23/2013 0935   LDLCALC 69 08/23/2013 0935   RADIOLOGY: No results found.  IMPRESSION:  1. Coronary artery disease involving native coronary artery of native heart without angina pectoris: Status post RCA stent in 1999   2. Hyperlipidemia with target LDL less than 70   3. Medication management   4. History of neuropathy     ASSESSMENT AND PLAN: Mr. Pickney is a 71 year old gentleman who is status post PTCA and stenting of his RCA in 1999. His last nuclear study in 2014 continued to show normal perfusion.  He had a remote history of smoking but quit smoking at age 37.  When I last seen him, he was on atorvastatin and omega-3 fatty acid for hyperlipidemia, metoprolol, succinate 25 mg daily for CAD, and was having significant peripheral neuropathy on gabapentin.  Over the past 2 years.  Essentially, it stopped taking all his medications.  He is lost 30 pounds.  His weight today is 182 and BMI 26.  He remains asymptomatic without chest pain.  He denies any change in exercise tolerance.  He walks his dog a mile at a time several days per week in addition to working out at the gym at least 2 days per week.  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  He has not had recent laboratory.  A complete set of fasting laboratory will be obtained.  His ECG is stable showing sinus bradycardia.  He's asymptomatic.  I will contact him regarding his laboratory to see if medication will be necessary with his lipids.  His blood pressure is stable without medication.  As long as he remains stable I will see him in one year for reevaluation.   Time spent: 25 minutes Troy Sine, MD, [facility]  08/05/2017 6:16 PM

## 2017-08-05 NOTE — Patient Instructions (Signed)
Medication Instructions:  Your physician recommends that you continue on your current medications as directed. Please refer to the Current Medication list given to you today.  Labwork: Please return for FASTING labs (CMET, CBC, Lipid, TSH)  Our in office lab hours are Monday-Friday 8:00-4:30, closed for lunch 1-2 pm.  No appointment needed.  Follow-Up: Your physician wants you to follow-up in: 12 MONTHS with Dr. Kelly. You will receive a reminder letter in the mail two months in advance. If you don't receive a letter, please call our office to schedule the follow-up appointment.   Any Other Special Instructions Will Be Listed Below (If Applicable).     If you need a refill on your cardiac medications before your next appointment, please call your pharmacy.   

## 2017-11-03 DIAGNOSIS — N183 Chronic kidney disease, stage 3 unspecified: Secondary | ICD-10-CM

## 2017-11-03 DIAGNOSIS — Z1211 Encounter for screening for malignant neoplasm of colon: Secondary | ICD-10-CM

## 2017-11-03 HISTORY — DX: Chronic kidney disease, stage 3 unspecified: N18.30

## 2017-11-03 HISTORY — DX: Encounter for screening for malignant neoplasm of colon: Z12.11

## 2018-02-26 ENCOUNTER — Ambulatory Visit (INDEPENDENT_AMBULATORY_CARE_PROVIDER_SITE_OTHER): Payer: Medicare Other | Admitting: Family Medicine

## 2018-02-26 ENCOUNTER — Encounter: Payer: Self-pay | Admitting: Family Medicine

## 2018-02-26 VITALS — BP 120/69 | HR 63 | Temp 97.7°F | Resp 16 | Ht 68.5 in | Wt 179.4 lb

## 2018-02-26 DIAGNOSIS — F17200 Nicotine dependence, unspecified, uncomplicated: Secondary | ICD-10-CM

## 2018-02-26 DIAGNOSIS — Z23 Encounter for immunization: Secondary | ICD-10-CM

## 2018-02-26 DIAGNOSIS — J301 Allergic rhinitis due to pollen: Secondary | ICD-10-CM

## 2018-02-26 MED ORDER — ZOSTER VAC RECOMB ADJUVANTED 50 MCG/0.5ML IM SUSR: 0.5000 mL | Freq: Once | INTRAMUSCULAR | 1 refills | Status: AC

## 2018-02-26 MED ORDER — VARENICLINE TARTRATE 0.5 MG X 11 & 1 MG X 42 PO MISC
ORAL | 0 refills | Status: DC
Start: 2018-02-26 — End: 2018-07-21

## 2018-02-26 MED ORDER — VARENICLINE TARTRATE 1 MG PO TABS: 1.0000 mg | ORAL_TABLET | Freq: Two times a day (BID) | ORAL | 1 refills | Status: DC

## 2018-02-26 NOTE — Progress Notes (Signed)
Office Note 03/07/2018  CC:  Chief Complaint  Patient presents with  . Establish Care  . Allergies    ?  [name] Nicotine Dependence    wants Rx for Chantix    HPI:  Patrick Price is a 72 y.o. White male who is here to establish care. Patient's most recent primary MD: Dr. Perrin Maltese at [facility] in [place]. Old records in EPIC/HL EMR were reviewed prior to or during today's visit.  Saw cardiologist 08/2017; was doing well OFF ALL MEDS x 2 YRS: health panel labs ordered by Dr. Tresa Endo (cards) but pt did not get these done.  He declines these today but wants to get these in future.  Wants to try chantix for cig cessation---this helped very well in the past.  Last couple months, lots of mucous in nose, dry cough---sometimes in "fits".  Tickle in throat.  Sneezing. No wheezing or SOB.  No HAs, fevers, face pain, or ST.  No eye sx's. Seems to be improving lately---taking flonase lately.  Mucinex was helping.    Past Medical History:  Diagnosis Date  . Ataxia 03/09/2015  . Bell's palsy    left  . Coronary artery disease 01/16/11   nuclear stress test- low risk scan. no ischemia or infarct/scar is seen EF 77%  . Hyperlipidemia   . Hypertension   . Loss of touch sensation on examination 03/09/2015  . Polyneuropathy 03/2015   NCS/EMG 2016: symmetric length-dependent moderately severe axonal sensorimotor polyneuropathy (no ulnar or median neuropathy, no radiculopathy).  . Stented coronary artery     Past Surgical History:  Procedure Laterality Date  . CARDIOVASCULAR STRESS TEST  2016   Myocard perf imaging: normal (EF/LV fxn normal, no ischemia)  . COLONOSCOPY  06/2006; 2014   2014 NORMAL.  Recall 10 yrs [facility])  . CORONARY ANGIOPLASTY     RCA stent 1999    Family History  Problem Relation Age of Onset  . Heart attack Mother   . Cancer Neg Hx   . Diabetes Neg Hx     Social History   Socioeconomic History  . Marital status: Married    Spouse name: Not on file  . Number of  children: 2  . Years of education: 11  . Highest education level: Not on file  Occupational History  . Occupation: Research officer, trade union- unemployed currently   Social Needs  . Financial resource strain: Not on file  . Food insecurity:    Worry: Not on file    Inability: Not on file  . Transportation needs:    Medical: Not on file    Non-medical: Not on file  Tobacco Use  . Smoking status: Former Smoker    Packs/day: 0.10    Years: 50.00    Pack years: 5.00    Types: Cigarettes    Last attempt to quit: 02/15/2007    Years since quitting: 11.0  . Smokeless tobacco: Never Used  Substance and Sexual Activity  . Alcohol use: Yes    Alcohol/week: 0.0 oz    Comment: rare, 3-4 x a yr  . Drug use: No  . Sexual activity: Not on file  Lifestyle  . Physical activity:    Days per week: Not on file    Minutes per session: Not on file  . Stress: Not on file  Relationships  . Social connections:    Talks on phone: Not on file    Gets together: Not on file    Attends religious service: Not on  file    Active member of club or organization: Not on file    Attends meetings of clubs or organizations: Not on file    Relationship status: Not on file  . Intimate partner violence:    Fear of current or ex partner: Not on file    Emotionally abused: Not on file    Physically abused: Not on file    Forced sexual activity: Not on file  Other Topics Concern  . Not on file  Social History Narrative   Married, one son and one daughter.   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   50 pack-yr hx.  Quit x 10 yrs, then restarted 2018.   No alcohol.         2-3 cups of caffeine daily    MEDS: Flonase qd.  No Known Allergies  ROS Review of Systems  Constitutional: Negative for fatigue and fever.  HENT: Positive for congestion (nasal--see hpi), postnasal drip and rhinorrhea. Negative for sore throat.   Eyes: Negative for visual disturbance.   Respiratory: Negative for cough.   Cardiovascular: Negative for chest pain.  Gastrointestinal: Negative for abdominal pain and nausea.  Genitourinary: Negative for dysuria.  Musculoskeletal: Negative for back pain and joint swelling.  Skin: Negative for rash.  Neurological: Negative for weakness and headaches.  Hematological: Negative for adenopathy.    PE; Blood pressure 120/69, pulse 63, temperature 97.7 F (36.5 C), temperature source Oral, resp. rate 16, height 5' 8.5" (1.74 m), weight 179 lb 6 oz (81.4 kg), SpO2 96 %. Body mass index is 26.88 kg/m.  Gen: Alert, well appearing.  Patient is oriented to person, place, time, and situation. AFFECT: pleasant, lucid thought and speech. WUJ:WJXB: no injection, icteris, swelling, or exudate.  EOMI, PERRLA. Mouth: lips without lesion/swelling.  Oral mucosa pink and moist. Oropharynx without erythema, exudate, or swelling.  CV: RRR, no m/r/g.   LUNGS: CTA bilat, nonlabored resps, good aeration in all lung fields. EXT: no clubbing, cyanosis, or edema.   Pertinent labs:  Lab Results  Component Value Date   TSH 0.939 02/25/2015   Lab Results  Component Value Date   WBC 10.5 05/02/2015   HGB 16.4 05/02/2015   HCT 50.2 05/02/2015   MCV 93 05/02/2015   PLT 128 (L) 05/02/2015   Lab Results  Component Value Date   CREATININE 1.44 (H) 05/02/2015   BUN 24 05/02/2015   NA 148 (H) 05/02/2015   K 5.0 05/02/2015   CL 105 05/02/2015   CO2 24 05/02/2015   Lab Results  Component Value Date   ALT 36 05/02/2015   AST 17 05/02/2015   ALKPHOS 63 05/02/2015   BILITOT 0.5 05/02/2015   Lab Results  Component Value Date   CHOL 141 08/23/2013   Lab Results  Component Value Date   HDL 30 (L) 08/23/2013   Lab Results  Component Value Date   LDLCALC 69 08/23/2013   Lab Results  Component Value Date   TRIG 212 (H) 08/23/2013   Lab Results  Component Value Date   CHOLHDL 4.7 08/23/2013   Lab Results  Component Value Date   PSA  0.24 02/25/2015   Lab Results  Component Value Date   HGBA1C 5.4 03/28/2015    ASSESSMENT AND PLAN:   New pt:  1) Tobacco dependence: pt wants to try chantix again b/c it helped well in the past. chantix rx'd today.  2)  Allergic rhinitis: continue flonase .  3) Preventative health care: prevnar 13 today, and a rx for shingrix was sent to pt's pharmacy today. He'll return in 4 mo for CPE and get CBC, CMET, FLP, and PSA.  An After Visit Summary was printed and given to the patient.  Return in about 4 months (around 06/28/2018) for annual CPE (fasting).  Signed:  Santiago BumpersPhil Taym Twist, MD           03/07/2018

## 2018-03-21 ENCOUNTER — Encounter: Payer: Self-pay | Admitting: Family Medicine

## 2018-03-30 LAB — FECAL OCCULT BLOOD, GUAIAC: Fecal Occult Blood: NEGATIVE

## 2018-04-05 ENCOUNTER — Encounter: Payer: Self-pay | Admitting: Family Medicine

## 2018-04-07 ENCOUNTER — Telehealth: Payer: Self-pay | Admitting: Family Medicine

## 2018-04-07 ENCOUNTER — Encounter: Payer: Self-pay | Admitting: Family Medicine

## 2018-04-07 NOTE — Telephone Encounter (Signed)
Pls notify pt that his stool test for colon cancer screening came back normal.  This is good.-thx

## 2018-04-07 NOTE — Telephone Encounter (Signed)
Patient notified and verbalized understanding. 

## 2018-06-28 ENCOUNTER — Ambulatory Visit (INDEPENDENT_AMBULATORY_CARE_PROVIDER_SITE_OTHER): Payer: Medicare Other | Admitting: Family Medicine

## 2018-06-28 ENCOUNTER — Encounter: Payer: Self-pay | Admitting: Family Medicine

## 2018-06-28 VITALS — BP 115/66 | HR 53 | Temp 97.7°F | Resp 16 | Ht 69.0 in | Wt 177.5 lb

## 2018-06-28 DIAGNOSIS — E663 Overweight: Secondary | ICD-10-CM

## 2018-06-28 DIAGNOSIS — Z0000 Encounter for general adult medical examination without abnormal findings: Secondary | ICD-10-CM

## 2018-06-28 DIAGNOSIS — Z87891 Personal history of nicotine dependence: Secondary | ICD-10-CM | POA: Diagnosis not present

## 2018-06-28 DIAGNOSIS — F17200 Nicotine dependence, unspecified, uncomplicated: Secondary | ICD-10-CM

## 2018-06-28 LAB — LIPID PANEL
Cholesterol: 200 mg/dL (ref 0–200)
HDL: 36.5 mg/dL — ABNORMAL LOW (ref >39.00)
NonHDL: 163.36
Total CHOL/HDL Ratio: 5
Triglycerides: 239 mg/dL — ABNORMAL HIGH (ref 0.0–149.0)
VLDL: 47.8 mg/dL — ABNORMAL HIGH (ref 0.0–40.0)

## 2018-06-28 LAB — COMPREHENSIVE METABOLIC PANEL
ALT: 15 U/L (ref 0–53)
AST: 17 U/L (ref 0–37)
Albumin: 4.4 g/dL (ref 3.5–5.2)
Alkaline Phosphatase: 75 U/L (ref 39–117)
BUN: 26 mg/dL — ABNORMAL HIGH (ref 6–23)
CO2: 26 mEq/L (ref 19–32)
Calcium: 9.8 mg/dL (ref 8.4–10.5)
Chloride: 108 mEq/L (ref 96–112)
Creatinine, Ser: 1.41 mg/dL (ref 0.40–1.50)
GFR: 52.53 mL/min — ABNORMAL LOW (ref >60.00)
Glucose, Bld: 95 mg/dL (ref 70–99)
Potassium: 4.2 mEq/L (ref 3.5–5.1)
Sodium: 141 mEq/L (ref 135–145)
Total Bilirubin: 0.6 mg/dL (ref 0.2–1.2)
Total Protein: 7.3 g/dL (ref 6.0–8.3)

## 2018-06-28 LAB — CBC WITH DIFFERENTIAL/PLATELET
Basophils Absolute: 0.1 10*3/uL (ref 0.0–0.1)
Basophils Relative: 1.9 % (ref 0.0–3.0)
Eosinophils Absolute: 0.1 10*3/uL (ref 0.0–0.7)
Eosinophils Relative: 1.9 % (ref 0.0–5.0)
HCT: 45.6 % (ref 39.0–52.0)
Hemoglobin: 15.6 g/dL (ref 13.0–17.0)
Lymphocytes Relative: 30.4 % (ref 12.0–46.0)
Lymphs Abs: 2.1 10*3/uL (ref 0.7–4.0)
MCHC: 34.3 g/dL (ref 30.0–36.0)
MCV: 90.6 fl (ref 78.0–100.0)
Monocytes Absolute: 0.6 10*3/uL (ref 0.1–1.0)
Monocytes Relative: 9.1 % (ref 3.0–12.0)
Neutro Abs: 3.9 10*3/uL (ref 1.4–7.7)
Neutrophils Relative %: 56.7 % (ref 43.0–77.0)
Platelets: 147 10*3/uL — ABNORMAL LOW (ref 150.0–400.0)
RBC: 5.03 Mil/uL (ref 4.22–5.81)
RDW: 14.1 % (ref 11.5–15.5)
WBC: 6.8 10*3/uL (ref 4.0–10.5)

## 2018-06-28 LAB — LDL CHOLESTEROL, DIRECT: Direct LDL: 127 mg/dL

## 2018-06-28 MED ORDER — TRIAMCINOLONE ACETONIDE 0.1 % EX CREA: 1.0000 "application " | TOPICAL_CREAM | Freq: Two times a day (BID) | CUTANEOUS | 2 refills | Status: DC

## 2018-06-28 NOTE — Progress Notes (Signed)
Office Note 06/28/2018  CC:  Chief Complaint  Patient presents with  . Annual Exam    Pt is fasting.     HPI:  Patrick Price is a 72 y.o. male who is here for annual health maintenance exam.  Chantix trial for smoking cessation at last visit 4 mo ago. He quit smoking!  Exercise: walks 2-4 miles per day. Diet; healthy!   30 lb purposeful wt loss over the last 8 mo. Dental preventatives UTD. Eyes; annual exams.   Past Medical History:  Diagnosis Date  . Ataxia 03/09/2015  . Bell's palsy    left  . Colon cancer screening 2019   FIT test neg 03/21/18  . Coronary artery disease 01/16/11   nuclear stress test- low risk scan. no ischemia or infarct/scar is seen EF 77%  . Hyperlipidemia   . Hypertension   . Loss of touch sensation on examination 03/09/2015  . Polyneuropathy 03/2015   NCS/EMG 2016: symmetric length-dependent moderately severe axonal sensorimotor polyneuropathy (no ulnar or median neuropathy, no radiculopathy).  . Stented coronary artery     Past Surgical History:  Procedure Laterality Date  . CARDIOVASCULAR STRESS TEST  2016   Myocard perf imaging: normal (EF/LV fxn normal, no ischemia)  . COLONOSCOPY  06/2006; 2014   2014 NORMAL.  Recall 10 yrs [facility]).  FIT test NEG 03/21/18.  . CORONARY ANGIOPLASTY     RCA stent 1999    Family History  Problem Relation Age of Onset  . Heart attack Mother   . Cancer Neg Hx   . Diabetes Neg Hx     Social History   Socioeconomic History  . Marital status: Married    Spouse name: Not on file  . Number of children: 2  . Years of education: 37  . Highest education level: Not on file  Occupational History  . Occupation: Research officer, trade union- unemployed currently   Social Needs  . Financial resource strain: Not on file  . Food insecurity:    Worry: Not on file    Inability: Not on file  . Transportation needs:    Medical: Not on file    Non-medical: Not on file  Tobacco Use  . Smoking status: Former Smoker     Packs/day: 0.10    Years: 50.00    Pack years: 5.00    Types: Cigarettes    Last attempt to quit: 02/15/2007    Years since quitting: 11.3  . Smokeless tobacco: Never Used  Substance and Sexual Activity  . Alcohol use: Yes    Alcohol/week: 0.0 standard drinks    Comment: rare, 3-4 x a yr  . Drug use: No  . Sexual activity: Not on file  Lifestyle  . Physical activity:    Days per week: Not on file    Minutes per session: Not on file  . Stress: Not on file  Relationships  . Social connections:    Talks on phone: Not on file    Gets together: Not on file    Attends religious service: Not on file    Active member of club or organization: Not on file    Attends meetings of clubs or organizations: Not on file    Relationship status: Not on file  . Intimate partner violence:    Fear of current or ex partner: Not on file    Emotionally abused: Not on file    Physically abused: Not on file    Forced sexual activity: Not on file  Other Topics Concern  . Not on file  Social History Narrative   Married, one son and one daughter.   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   50 pack-yr hx.  Quit x 10 yrs, then restarted 2018.   No alcohol.         2-3 cups of caffeine daily    MEDS: none currently  No Known Allergies  ROS Review of Systems  Constitutional: Negative for appetite change, chills, fatigue and fever.  HENT: Negative for congestion, dental problem, ear pain and sore throat.   Eyes: Negative for discharge, redness and visual disturbance.  Respiratory: Negative for cough, chest tightness, shortness of breath and wheezing.   Cardiovascular: Negative for chest pain, palpitations and leg swelling.  Gastrointestinal: Negative for abdominal pain, blood in stool, diarrhea, nausea and vomiting.  Genitourinary: Negative for difficulty urinating, dysuria, flank pain, frequency, hematuria and urgency.  Musculoskeletal:  Negative for arthralgias, back pain, joint swelling, myalgias and neck stiffness.  Skin: Negative for pallor and rash.  Neurological: Negative for dizziness, speech difficulty, weakness and headaches.  Hematological: Negative for adenopathy. Does not bruise/bleed easily.  Psychiatric/Behavioral: Negative for confusion and sleep disturbance. The patient is not nervous/anxious.     PE; Blood pressure 115/66, pulse (!) 53, temperature 97.7 F (36.5 C), temperature source Oral, resp. rate 16, height 5\' 9"  (1.753 m), weight 177 lb 8 oz (80.5 kg), SpO2 96 %. Body mass index is 26.21 kg/m.  Gen: Alert, well appearing.  Patient is oriented to person, place, time, and situation. AFFECT: pleasant, lucid thought and speech. ENT: Ears: EACs clear, normal epithelium.  TMs with good light reflex and landmarks bilaterally.  Eyes: no injection, icteris, swelling, or exudate.  EOMI, PERRLA. Nose: no drainage or turbinate edema/swelling.  No injection or focal lesion.  Mouth: lips without lesion/swelling.  Oral mucosa pink and moist.  Dentition intact and without obvious caries or gingival swelling.  Oropharynx without erythema, exudate, or swelling.  Neck: supple/nontender.  No LAD, mass, or TM.  Carotid pulses 2+ bilaterally, without bruits. CV: RRR, no m/r/g.   LUNGS: CTA bilat, nonlabored resps, good aeration in all lung fields. ABD: soft, NT, ND, BS normal.  No hepatospenomegaly or mass.  No bruits. EXT: no clubbing, cyanosis, or edema.  Musculoskeletal: no joint swelling, erythema, warmth, or tenderness.  ROM of all joints intact. Skin - no sores or suspicious lesions or rashes or color changes DRE: pt declined  Pertinent labs:  Lab Results  Component Value Date   TSH 0.939 02/25/2015   Lab Results  Component Value Date   WBC 10.5 05/02/2015   HGB 16.4 05/02/2015   HCT 50.2 05/02/2015   MCV 93 05/02/2015   PLT 128 (L) 05/02/2015   Lab Results  Component Value Date   CREATININE 1.44 (H)  05/02/2015   BUN 24 05/02/2015   NA 148 (H) 05/02/2015   K 5.0 05/02/2015   CL 105 05/02/2015   CO2 24 05/02/2015   Lab Results  Component Value Date   ALT 36 05/02/2015   AST 17 05/02/2015   ALKPHOS 63 05/02/2015   BILITOT 0.5 05/02/2015   Lab Results  Component Value Date   CHOL 141 08/23/2013   Lab Results  Component Value Date   HDL 30 (L) 08/23/2013   Lab Results  Component Value Date   LDLCALC 69 08/23/2013   Lab Results  Component Value Date  TRIG 212 (H) 08/23/2013   Lab Results  Component Value Date   CHOLHDL 4.7 08/23/2013   Lab Results  Component Value Date   PSA 0.24 02/25/2015   Lab Results  Component Value Date   HGBA1C 5.4 03/28/2015    ASSESSMENT AND PLAN:   Health maintenance exam: Reviewed age and gender appropriate health maintenance issues (prudent diet, regular exercise, health risks of tobacco and excessive alcohol, use of seatbelts, fire alarms in home, use of sunscreen).  Also reviewed age and gender appropriate health screening as well as vaccine recommendations. Vaccines: UTD. Labs: fasting lipids, CBC, CMET. Prostate ca screening: declines any further screening. Colon ca screening: FIT neg 03/2018-->plan repeat 1 yr. Lung cancer screening: he has > 50 pack-yr hx of smoking, quit a few months ago.  Will get low dose CT chest w/out contrast for lung ca screening.  An After Visit Summary was printed and given to the patient.  FOLLOW UP:  Return in about 1 year (around 06/29/2019) for annual CPE (fasting).  Signed:  Santiago BumpersPhil Glenmore Karl, MD           06/28/2018

## 2018-06-28 NOTE — Patient Instructions (Signed)

## 2018-06-29 ENCOUNTER — Encounter: Payer: Self-pay | Admitting: Family Medicine

## 2018-07-04 DIAGNOSIS — J189 Pneumonia, unspecified organism: Secondary | ICD-10-CM

## 2018-07-04 HISTORY — DX: Pneumonia, unspecified organism: J18.9

## 2018-07-09 ENCOUNTER — Ambulatory Visit: Payer: Medicare Other

## 2018-07-15 ENCOUNTER — Ambulatory Visit
Admission: RE | Admit: 2018-07-15 | Discharge: 2018-07-15 | Disposition: A | Discharge status: Home / Self Care | Payer: Medicare Other | Source: Ambulatory Visit | Attending: Family Medicine | Admitting: Family Medicine

## 2018-07-15 ENCOUNTER — Telehealth: Payer: Self-pay | Admitting: Family Medicine

## 2018-07-15 DIAGNOSIS — Z87891 Personal history of nicotine dependence: Secondary | ICD-10-CM | POA: Diagnosis not present

## 2018-07-15 NOTE — Telephone Encounter (Signed)
Patient is returning call regarding his results from CT scan.  Please call patient back.  # [phone].

## 2018-07-15 NOTE — Telephone Encounter (Signed)
[facility] called to report that the results for lung CT is available in Epic; he requests that this information be forwarded to Dr Milinda CaveMcGowen; will route to office for provider review.

## 2018-07-16 ENCOUNTER — Other Ambulatory Visit: Payer: Self-pay | Admitting: Family Medicine

## 2018-07-16 MED ORDER — CEFDINIR 300 MG PO CAPS: 300.0000 mg | ORAL_CAPSULE | Freq: Two times a day (BID) | ORAL | 0 refills | Status: DC

## 2018-07-16 NOTE — Telephone Encounter (Signed)
I sent in abx.  Pls have pt arrange f/u with me mid next week if he has not already made f/u appt.-thx

## 2018-07-16 NOTE — Telephone Encounter (Signed)
Patient contacted with results.  See result message.

## 2018-07-16 NOTE — Telephone Encounter (Signed)
Pt called to follow up. Please advise.

## 2018-07-19 ENCOUNTER — Encounter: Payer: Self-pay | Admitting: Family Medicine

## 2018-07-19 NOTE — Telephone Encounter (Signed)
Patient aware of Abx and has an appointment set 07/21/18 @ 8am.

## 2018-07-21 ENCOUNTER — Ambulatory Visit (INDEPENDENT_AMBULATORY_CARE_PROVIDER_SITE_OTHER): Payer: Medicare Other | Admitting: Family Medicine

## 2018-07-21 ENCOUNTER — Encounter: Payer: Self-pay | Admitting: Family Medicine

## 2018-07-21 VITALS — BP 95/58 | HR 56 | Temp 97.9°F | Resp 16 | Ht 69.0 in | Wt 175.5 lb

## 2018-07-21 DIAGNOSIS — J189 Pneumonia, unspecified organism: Secondary | ICD-10-CM

## 2018-07-21 DIAGNOSIS — J181 Lobar pneumonia, unspecified organism: Secondary | ICD-10-CM

## 2018-07-21 DIAGNOSIS — Z23 Encounter for immunization: Secondary | ICD-10-CM

## 2018-07-21 DIAGNOSIS — Z87891 Personal history of nicotine dependence: Secondary | ICD-10-CM | POA: Diagnosis not present

## 2018-07-21 DIAGNOSIS — Z122 Encounter for screening for malignant neoplasm of respiratory organs: Secondary | ICD-10-CM

## 2018-07-21 NOTE — Progress Notes (Signed)
OFFICE VISIT  07/21/2018   CC:  Chief Complaint  Patient presents with  . Follow-up    pneumonia   HPI:    Patient is a 72 y.o. Caucasian male who presents for f/u pneumonia. His lung ca screening CT showed infiltrate.  When we called pt he admitted he had been coughing, feeling malaise and fatigue recently. Started pt on omnicef and told him to f/u today to see how he was doing. He feels much improved.  Still with lingering fatigue, poor tolerance to his daily walks with his dog. No fevers, no dizziness, no CP, cough lessened.  Eating and drinking fine.  Sleeping through the night now. No symptomatic meds.  Taking abx as rx'd.  Pertinent imaging--> 07/15/18 EXAM: CT CHEST WITHOUT CONTRAST LOW-DOSE FOR LUNG CANCER SCREENING  TECHNIQUE: Multidetector CT imaging of the chest was performed following the standard protocol without IV contrast.  COMPARISON:  No priors.  FINDINGS: Cardiovascular: Heart size is normal. There is no significant pericardial fluid, thickening or pericardial calcification. There is aortic atherosclerosis, as well as atherosclerosis of the great vessels of the mediastinum and the coronary arteries, including calcified atherosclerotic plaque in the left main, left anterior descending and right coronary arteries. Calcifications of the aortic valve.  Mediastinum/Nodes: No pathologically enlarged mediastinal or hilar lymph nodes. Please note that accurate exclusion of hilar adenopathy is limited on noncontrast CT scans. Esophagus is unremarkable in appearance. No axillary lymphadenopathy.  Lungs/Pleura: Extensive airspace consolidation in the right lower lobe in a peribronchovascular distribution with surrounding areas of ground-glass attenuation, most compatible with an acute bronchopneumonia. This severely limits today's examination. Diffuse bronchial wall thickening with mild to moderate centrilobular and paraseptal emphysema. No pleural  effusions.  Upper Abdomen: Aortic atherosclerosis.  Musculoskeletal: There are no aggressive appearing lytic or blastic lesions noted in the visualized portions of the skeleton.  IMPRESSION: 1. Lung-RADS 0S, incomplete. There appears to be in acute bronchopneumonia in the right lower lobe on today's examination which severely limits today's is study. Lung cancer screening should always be performed in healthy asymptomatic patients to avoid confounding by infectious/inflammatory processes. Clinical evaluation and treatment for presumed bronchopneumonia is recommended, followed by repeat low-dose lung cancer screening chest CT in 3 months. 2. The "S" modifier above refers to potentially clinically significant non lung cancer related findings. Specifically, there is aortic atherosclerosis, in addition to left main and 2 vessel coronary artery disease. Assessment for potential risk factor modification, dietary therapy or pharmacologic therapy may be warranted, if clinically indicated. 3. Mild diffuse bronchial wall thickening with mild to moderate centrilobular and paraseptal emphysema; imaging findings suggestive of underlying COPD.  Past Medical History:  Diagnosis Date  . Ataxia 03/09/2015  . Bell's palsy    left  . CAP (community acquired pneumonia) 07/2018   Noted on noncontrast, low dose CT done for lung ca screening.  . Chronic renal insufficiency, stage 3 (moderate) (HCC) 2019   GFR 50s  . Colon cancer screening 2019   FIT test neg 03/21/18  . Coronary artery disease 01/16/11   nuclear stress test- low risk scan. no ischemia or infarct/scar is seen EF 77%  . Hyperlipidemia    Recommended pt restart atorva 40 mg qd 06/28/18 but he declined, stating he had intol to rosuva and atorva (?cough? per pt).  . Hypertension   . Loss of touch sensation on examination 03/09/2015  . Polyneuropathy 03/2015   NCS/EMG 2016: symmetric length-dependent moderately severe axonal sensorimotor  polyneuropathy (no ulnar or median neuropathy,  no radiculopathy).  . Stented coronary artery     Past Surgical History:  Procedure Laterality Date  . CARDIOVASCULAR STRESS TEST  2016   Myocard perf imaging: normal (EF/LV fxn normal, no ischemia)  . COLONOSCOPY  06/2006; 2014   2014 NORMAL.  Recall 10 yrs Deboraha Sprang([facility]).  FIT test NEG 03/21/18.  . CORONARY ANGIOPLASTY     RCA stent 1999    Outpatient Medications Prior to Visit  Medication Sig Dispense Refill  . cefdinir (OMNICEF) 300 MG capsule Take 1 capsule (300 mg total) by mouth 2 (two) times daily. 20 capsule 0  . triamcinolone cream (KENALOG) 0.1 % Apply 1 application topically 2 (two) times daily. 30 g 2  . varenicline (CHANTIX CONTINUING MONTH PAK) 1 MG tablet Take 1 tablet (1 mg total) by mouth 2 (two) times daily. (Patient not taking: Reported on 06/28/2018) 60 tablet 1  . varenicline (CHANTIX STARTING MONTH PAK) 0.5 MG X 11 & 1 MG X 42 tablet Take one 0.5 mg tablet by mouth once daily for 3 days, then increase to one 0.5 mg tablet twice daily for 4 days, then increase to one 1 mg tablet twice daily. (Patient not taking: Reported on 06/28/2018) 53 tablet 0   No facility-administered medications prior to visit.     No Known Allergies  ROS As per HPI  PE: Blood pressure (!) 95/58, pulse (!) 56, temperature 97.9 F (36.6 C), temperature source Oral, resp. rate 16, height 5\' 9"  (1.753 m), weight 175 lb 8 oz (79.6 kg), SpO2 97 %. Gen: Alert, well appearing.  Patient is oriented to person, place, time, and situation. AFFECT: pleasant, lucid thought and speech. ZOX:WRUEENT:Eyes: no injection, icteris, swelling, or exudate.  EOMI, PERRLA. Mouth: lips without lesion/swelling.  Oral mucosa pink and moist. Oropharynx without erythema, exudate, or swelling.  CV: RRR, no m/r/g.   LUNGS: CTA bilat, nonlabored resps, good aeration in all lung fields. EXT: no clubbing or cyanosis.  no edema.     LABS:    Chemistry      Component Value Date/Time    NA 141 06/28/2018 0846   NA 148 (H) 05/02/2015 1523   K 4.2 06/28/2018 0846   CL 108 06/28/2018 0846   CO2 26 06/28/2018 0846   BUN 26 (H) 06/28/2018 0846   BUN 24 05/02/2015 1523   CREATININE 1.41 06/28/2018 0846   CREATININE 1.14 08/23/2013 0935      Component Value Date/Time   CALCIUM 9.8 06/28/2018 0846   ALKPHOS 75 06/28/2018 0846   AST 17 06/28/2018 0846   ALT 15 06/28/2018 0846   BILITOT 0.6 06/28/2018 0846   BILITOT 0.5 05/02/2015 1523     GFR = 53 on 06/28/18  Lab Results  Component Value Date   WBC 6.8 06/28/2018   HGB 15.6 06/28/2018   HCT 45.6 06/28/2018   MCV 90.6 06/28/2018   PLT 147.0 (L) 06/28/2018     IMPRESSION AND PLAN:  CAP: he looks great, feeling appropriately improved on omnicef. Lungs clear, oxygen normal.   He is ok to finish out his abx, f/u prn.  We need to repeat his lung ca screening CT in 3 mo-->ordered today.  Hi quit smoking about 4 mo ago. If he is feeling ill at all leading up to the time of getting this test then he is to come and see me first.  An After Visit Summary was printed and given to the patient.  FOLLOW UP: Return for 11 mo CPE.  Signed:  Santiago Bumpers, MD           07/21/2018

## 2018-11-12 ENCOUNTER — Ambulatory Visit
Admission: RE | Admit: 2018-11-12 | Discharge: 2018-11-12 | Disposition: A | Discharge status: Home / Self Care | Payer: Medicare Other | Source: Ambulatory Visit | Attending: Family Medicine | Admitting: Family Medicine

## 2018-11-12 DIAGNOSIS — J439 Emphysema, unspecified: Secondary | ICD-10-CM | POA: Diagnosis not present

## 2018-11-12 DIAGNOSIS — Z122 Encounter for screening for malignant neoplasm of respiratory organs: Secondary | ICD-10-CM

## 2018-11-12 DIAGNOSIS — Z87891 Personal history of nicotine dependence: Secondary | ICD-10-CM

## 2019-01-02 DIAGNOSIS — I213 ST elevation (STEMI) myocardial infarction of unspecified site: Secondary | ICD-10-CM

## 2019-01-02 HISTORY — DX: ST elevation (STEMI) myocardial infarction of unspecified site: I21.3

## 2019-01-23 ENCOUNTER — Encounter (HOSPITAL_COMMUNITY)
Admission: EM | Disposition: A | Discharge status: Home / Self Care | Payer: Self-pay | Source: Home / Self Care | Attending: Cardiovascular Disease

## 2019-01-23 ENCOUNTER — Other Ambulatory Visit: Payer: Self-pay

## 2019-01-23 ENCOUNTER — Emergency Department (HOSPITAL_COMMUNITY): Payer: Medicare Other

## 2019-01-23 ENCOUNTER — Encounter (HOSPITAL_COMMUNITY): Payer: Self-pay | Admitting: Emergency Medicine

## 2019-01-23 ENCOUNTER — Inpatient Hospital Stay (HOSPITAL_COMMUNITY)
Admission: EM | Admit: 2019-01-23 | Discharge: 2019-01-24 | DRG: 247 | Disposition: A | Discharge status: Home / Self Care | Payer: Medicare Other | Attending: Cardiovascular Disease | Admitting: Cardiovascular Disease

## 2019-01-23 DIAGNOSIS — I214 Non-ST elevation (NSTEMI) myocardial infarction: Secondary | ICD-10-CM | POA: Diagnosis present

## 2019-01-23 DIAGNOSIS — Z7952 Long term (current) use of systemic steroids: Secondary | ICD-10-CM | POA: Diagnosis not present

## 2019-01-23 DIAGNOSIS — N183 Chronic kidney disease, stage 3 (moderate): Secondary | ICD-10-CM | POA: Diagnosis present

## 2019-01-23 DIAGNOSIS — I2510 Atherosclerotic heart disease of native coronary artery without angina pectoris: Secondary | ICD-10-CM | POA: Diagnosis not present

## 2019-01-23 DIAGNOSIS — Z955 Presence of coronary angioplasty implant and graft: Secondary | ICD-10-CM

## 2019-01-23 DIAGNOSIS — Z87891 Personal history of nicotine dependence: Secondary | ICD-10-CM | POA: Diagnosis not present

## 2019-01-23 DIAGNOSIS — I2119 ST elevation (STEMI) myocardial infarction involving other coronary artery of inferior wall: Secondary | ICD-10-CM | POA: Diagnosis not present

## 2019-01-23 DIAGNOSIS — Z8249 Family history of ischemic heart disease and other diseases of the circulatory system: Secondary | ICD-10-CM | POA: Diagnosis not present

## 2019-01-23 DIAGNOSIS — E782 Mixed hyperlipidemia: Secondary | ICD-10-CM

## 2019-01-23 DIAGNOSIS — I129 Hypertensive chronic kidney disease with stage 1 through stage 4 chronic kidney disease, or unspecified chronic kidney disease: Secondary | ICD-10-CM | POA: Diagnosis present

## 2019-01-23 DIAGNOSIS — Z9861 Coronary angioplasty status: Secondary | ICD-10-CM

## 2019-01-23 DIAGNOSIS — E785 Hyperlipidemia, unspecified: Secondary | ICD-10-CM | POA: Diagnosis not present

## 2019-01-23 DIAGNOSIS — I2111 ST elevation (STEMI) myocardial infarction involving right coronary artery: Secondary | ICD-10-CM

## 2019-01-23 DIAGNOSIS — Z79899 Other long term (current) drug therapy: Secondary | ICD-10-CM | POA: Diagnosis not present

## 2019-01-23 DIAGNOSIS — R030 Elevated blood-pressure reading, without diagnosis of hypertension: Secondary | ICD-10-CM | POA: Diagnosis not present

## 2019-01-23 DIAGNOSIS — R079 Chest pain, unspecified: Secondary | ICD-10-CM | POA: Diagnosis not present

## 2019-01-23 HISTORY — PX: LEFT HEART CATH AND CORONARY ANGIOGRAPHY: CATH118249

## 2019-01-23 HISTORY — DX: Coronary angioplasty status: Z98.61

## 2019-01-23 HISTORY — DX: Atherosclerotic heart disease of native coronary artery without angina pectoris: I25.10

## 2019-01-23 HISTORY — PX: CORONARY/GRAFT ACUTE MI REVASCULARIZATION: CATH118305

## 2019-01-23 LAB — BASIC METABOLIC PANEL
Anion gap: 6 (ref 5–15)
BUN: 14 mg/dL (ref 8–23)
CO2: 25 mmol/L (ref 22–32)
Calcium: 9.1 mg/dL (ref 8.9–10.3)
Chloride: 108 mmol/L (ref 98–111)
Creatinine, Ser: 1.3 mg/dL — ABNORMAL HIGH (ref 0.61–1.24)
GFR calc Af Amer: >60 mL/min (ref >60)
GFR calc non Af Amer: 55 mL/min — ABNORMAL LOW (ref >60)
Glucose, Bld: 97 mg/dL (ref 70–99)
Potassium: 4.4 mmol/L (ref 3.5–5.1)
Sodium: 139 mmol/L (ref 135–145)

## 2019-01-23 LAB — I-STAT TROPONIN, ED: Troponin i, poc: 0.73 ng/mL (ref 0.00–0.08)

## 2019-01-23 LAB — CBC
HCT: 46.3 % (ref 39.0–52.0)
Hemoglobin: 15.8 g/dL (ref 13.0–17.0)
MCH: 30.5 pg (ref 26.0–34.0)
MCHC: 34.1 g/dL (ref 30.0–36.0)
MCV: 89.4 fL (ref 80.0–100.0)
Platelets: 158 10*3/uL (ref 150–400)
RBC: 5.18 MIL/uL (ref 4.22–5.81)
RDW: 13.4 % (ref 11.5–15.5)
WBC: 7.8 10*3/uL (ref 4.0–10.5)
nRBC: 0 % (ref 0.0–0.2)

## 2019-01-23 LAB — TROPONIN I
Troponin I: 0.87 ng/mL (ref <0.03)
Troponin I: 1.12 ng/mL (ref <0.03)

## 2019-01-23 LAB — MRSA PCR SCREENING: MRSA by PCR: NEGATIVE

## 2019-01-23 LAB — HEMOGLOBIN A1C
Hgb A1c MFr Bld: 5 % (ref 4.8–5.6)
Mean Plasma Glucose: 96.8 mg/dL

## 2019-01-23 LAB — APTT: aPTT: 31 seconds (ref 24–36)

## 2019-01-23 LAB — PROTIME-INR
INR: 1 (ref 0.8–1.2)
Prothrombin Time: 13 seconds (ref 11.4–15.2)

## 2019-01-23 LAB — POCT ACTIVATED CLOTTING TIME
Activated Clotting Time: 202 seconds
Activated Clotting Time: 158 seconds

## 2019-01-23 SURGERY — CORONARY/GRAFT ACUTE MI REVASCULARIZATION
Anesthesia: LOCAL

## 2019-01-23 MED ORDER — TICAGRELOR 90 MG PO TABS
90.0000 mg | ORAL_TABLET | Freq: Two times a day (BID) | ORAL | Status: DC
  Administered 2019-01-24: 90 mg via ORAL
  Filled 2019-01-23 (×2): qty 1

## 2019-01-23 MED ORDER — HEPARIN (PORCINE) IN NACL 1000-0.9 UT/500ML-% IV SOLN
INTRAVENOUS | Status: DC | PRN
  Administered 2019-01-23 (×2): 500 mL

## 2019-01-23 MED ORDER — ASPIRIN EC 81 MG PO TBEC
81.0000 mg | DELAYED_RELEASE_TABLET | Freq: Every day | ORAL | Status: DC
  Administered 2019-01-24: 81 mg via ORAL
  Filled 2019-01-23: qty 1

## 2019-01-23 MED ORDER — HEPARIN SODIUM (PORCINE) 1000 UNIT/ML IJ SOLN
INTRAMUSCULAR | Status: AC
  Filled 2019-01-23: qty 1

## 2019-01-23 MED ORDER — LIDOCAINE HCL (PF) 1 % IJ SOLN
INTRAMUSCULAR | Status: AC
  Filled 2019-01-23: qty 30

## 2019-01-23 MED ORDER — CANGRELOR TETRASODIUM 50 MG IV SOLR
INTRAVENOUS | Status: AC
  Filled 2019-01-23: qty 50

## 2019-01-23 MED ORDER — SODIUM CHLORIDE 0.9 % IV SOLN
INTRAVENOUS | Status: AC | PRN
  Administered 2019-01-23: 4 ug/kg/min via INTRAVENOUS

## 2019-01-23 MED ORDER — ACETAMINOPHEN 325 MG PO TABS: 650.0000 mg | ORAL_TABLET | ORAL | Status: DC | PRN

## 2019-01-23 MED ORDER — SODIUM CHLORIDE 0.9% FLUSH
3.0000 mL | Freq: Two times a day (BID) | INTRAVENOUS | Status: DC
  Administered 2019-01-24: 3 mL via INTRAVENOUS
  Administered 2019-01-24: 6 mL via INTRAVENOUS

## 2019-01-23 MED ORDER — SODIUM CHLORIDE 0.9 % WEIGHT BASED INFUSION
3.0000 mL/kg/h | INTRAVENOUS | Status: DC
  Administered 2019-01-23: 3 mL/kg/h via INTRAVENOUS

## 2019-01-23 MED ORDER — ATORVASTATIN CALCIUM 80 MG PO TABS
80.0000 mg | ORAL_TABLET | Freq: Every day | ORAL | Status: DC
  Administered 2019-01-23: 80 mg via ORAL
  Filled 2019-01-23: qty 1

## 2019-01-23 MED ORDER — MIDAZOLAM HCL 2 MG/2ML IJ SOLN
INTRAMUSCULAR | Status: DC | PRN
  Administered 2019-01-23 (×2): 1 mg via INTRAVENOUS

## 2019-01-23 MED ORDER — CANGRELOR BOLUS VIA INFUSION
INTRAVENOUS | Status: DC | PRN
  Administered 2019-01-23: 2289 ug via INTRAVENOUS

## 2019-01-23 MED ORDER — LABETALOL HCL 5 MG/ML IV SOLN: 10.0000 mg | INTRAVENOUS | Status: AC | PRN

## 2019-01-23 MED ORDER — FENTANYL CITRATE (PF) 100 MCG/2ML IJ SOLN
INTRAMUSCULAR | Status: AC
  Filled 2019-01-23: qty 2

## 2019-01-23 MED ORDER — ASPIRIN 300 MG RE SUPP: 300.0000 mg | RECTAL | Status: AC

## 2019-01-23 MED ORDER — SODIUM CHLORIDE 0.9 % WEIGHT BASED INFUSION: 1.0000 mL/kg/h | INTRAVENOUS | Status: DC

## 2019-01-23 MED ORDER — HEPARIN SODIUM (PORCINE) 1000 UNIT/ML IJ SOLN
INTRAMUSCULAR | Status: DC | PRN
  Administered 2019-01-23: 6000 [IU] via INTRAVENOUS
  Administered 2019-01-23: 4000 [IU] via INTRAVENOUS

## 2019-01-23 MED ORDER — HEPARIN (PORCINE) IN NACL 1000-0.9 UT/500ML-% IV SOLN
INTRAVENOUS | Status: AC
  Filled 2019-01-23: qty 1000

## 2019-01-23 MED ORDER — SODIUM CHLORIDE 0.9 % IV SOLN: 250.0000 mL | INTRAVENOUS | Status: DC | PRN

## 2019-01-23 MED ORDER — LIDOCAINE HCL (PF) 1 % IJ SOLN
INTRAMUSCULAR | Status: DC | PRN
  Administered 2019-01-23: 18 mL via SUBCUTANEOUS
  Administered 2019-01-23: 5 mL via SUBCUTANEOUS

## 2019-01-23 MED ORDER — NITROGLYCERIN 0.4 MG SL SUBL: 0.4000 mg | SUBLINGUAL_TABLET | SUBLINGUAL | Status: DC | PRN

## 2019-01-23 MED ORDER — HEPARIN (PORCINE) 25000 UT/250ML-% IV SOLN
950.0000 [IU]/h | INTRAVENOUS | Status: DC
  Administered 2019-01-23: 950 [IU]/h via INTRAVENOUS
  Filled 2019-01-23: qty 250

## 2019-01-23 MED ORDER — SODIUM CHLORIDE 0.9% FLUSH
3.0000 mL | INTRAVENOUS | Status: DC | PRN
  Administered 2019-01-24: 3 mL via INTRAVENOUS
  Filled 2019-01-23: qty 3

## 2019-01-23 MED ORDER — NITROGLYCERIN 1 MG/10 ML FOR IR/CATH LAB
INTRA_ARTERIAL | Status: DC | PRN
  Administered 2019-01-23: 200 ug via INTRACORONARY

## 2019-01-23 MED ORDER — SODIUM CHLORIDE 0.9% FLUSH: 3.0000 mL | INTRAVENOUS | Status: DC | PRN

## 2019-01-23 MED ORDER — MIDAZOLAM HCL 2 MG/2ML IJ SOLN
INTRAMUSCULAR | Status: AC
  Filled 2019-01-23: qty 2

## 2019-01-23 MED ORDER — SODIUM CHLORIDE 0.9% FLUSH: 3.0000 mL | Freq: Two times a day (BID) | INTRAVENOUS | Status: DC

## 2019-01-23 MED ORDER — SODIUM CHLORIDE 0.9% FLUSH
3.0000 mL | Freq: Once | INTRAVENOUS | Status: AC
  Administered 2019-01-23: 3 mL via INTRAVENOUS

## 2019-01-23 MED ORDER — FENTANYL CITRATE (PF) 100 MCG/2ML IJ SOLN
INTRAMUSCULAR | Status: DC | PRN
  Administered 2019-01-23 (×3): 25 ug via INTRAVENOUS

## 2019-01-23 MED ORDER — TICAGRELOR 90 MG PO TABS
ORAL_TABLET | ORAL | Status: DC | PRN
  Administered 2019-01-23: 180 mg via ORAL

## 2019-01-23 MED ORDER — HEPARIN BOLUS VIA INFUSION
4000.0000 [IU] | Freq: Once | INTRAVENOUS | Status: AC
  Administered 2019-01-23: 4000 [IU] via INTRAVENOUS
  Filled 2019-01-23: qty 4000

## 2019-01-23 MED ORDER — SODIUM CHLORIDE 0.9 % IV SOLN
INTRAVENOUS | Status: AC
  Administered 2019-01-23: 20:00:00 via INTRAVENOUS

## 2019-01-23 MED ORDER — NITROGLYCERIN 1 MG/10 ML FOR IR/CATH LAB
INTRA_ARTERIAL | Status: AC
  Filled 2019-01-23: qty 10

## 2019-01-23 MED ORDER — ASPIRIN 81 MG PO CHEW: 324.0000 mg | CHEWABLE_TABLET | ORAL | Status: AC

## 2019-01-23 MED ORDER — VERAPAMIL HCL 2.5 MG/ML IV SOLN
INTRAVENOUS | Status: AC
  Filled 2019-01-23: qty 2

## 2019-01-23 MED ORDER — VERAPAMIL HCL 2.5 MG/ML IV SOLN
INTRAVENOUS | Status: DC | PRN
  Administered 2019-01-23: 10 mL via INTRA_ARTERIAL

## 2019-01-23 MED ORDER — IOHEXOL 350 MG/ML SOLN
INTRAVENOUS | Status: DC | PRN
  Administered 2019-01-23: 130 mL via INTRA_ARTERIAL

## 2019-01-23 MED ORDER — SODIUM CHLORIDE 0.9 % IV SOLN
4.0000 ug/kg/min | INTRAVENOUS | Status: AC
  Administered 2019-01-23: 4 ug/kg/min via INTRAVENOUS
  Filled 2019-01-23: qty 50

## 2019-01-23 MED ORDER — ASPIRIN 81 MG PO CHEW: 81.0000 mg | CHEWABLE_TABLET | ORAL | Status: DC

## 2019-01-23 MED ORDER — ONDANSETRON HCL 4 MG/2ML IJ SOLN: 4.0000 mg | Freq: Four times a day (QID) | INTRAMUSCULAR | Status: DC | PRN

## 2019-01-23 MED ORDER — TICAGRELOR 90 MG PO TABS
ORAL_TABLET | ORAL | Status: AC
  Filled 2019-01-23: qty 2

## 2019-01-23 MED ORDER — HYDRALAZINE HCL 20 MG/ML IJ SOLN: 5.0000 mg | INTRAMUSCULAR | Status: AC | PRN

## 2019-01-23 SURGICAL SUPPLY — 23 items
BALLN SAPPHIRE 2.0X12 (BALLOONS) ×2
BALLN SAPPHIRE ~~LOC~~ 2.75X12 (BALLOONS) ×2 IMPLANT
BALLOON SAPPHIRE 2.0X12 (BALLOONS) ×1 IMPLANT
CATH INFINITI 5FR MULTPACK ANG (CATHETERS) ×2 IMPLANT
CATH OPTITORQUE TIG 4.0 5F (CATHETERS) ×2 IMPLANT
CATH VISTA GUIDE 6FR JR4 (CATHETERS) ×2 IMPLANT
DEVICE RAD COMP TR BAND LRG (VASCULAR PRODUCTS) ×2 IMPLANT
GLIDESHEATH SLEND SS 6F .021 (SHEATH) ×2 IMPLANT
GUIDEWIRE INQWIRE 1.5J.035X260 (WIRE) ×1 IMPLANT
INQWIRE 1.5J .035X260CM (WIRE) ×2
KIT ENCORE 26 ADVANTAGE (KITS) ×2 IMPLANT
KIT HEART LEFT (KITS) ×2 IMPLANT
PACK CARDIAC CATHETERIZATION (CUSTOM PROCEDURE TRAY) ×2 IMPLANT
SHEATH PINNACLE 6F 10CM (SHEATH) ×2 IMPLANT
SHEATH PROBE COVER 6X72 (BAG) ×2 IMPLANT
STENT RESOLUTE ONYX 2.5X15 (Permanent Stent) ×2 IMPLANT
STENT RESOLUTE ONYX 2.5X18 (Permanent Stent) ×2 IMPLANT
SYR MEDRAD MARK 7 150ML (SYRINGE) ×2 IMPLANT
TRANSDUCER W/STOPCOCK (MISCELLANEOUS) ×2 IMPLANT
TUBING CIL FLEX 10 FLL-RA (TUBING) ×2 IMPLANT
WIRE ASAHI PROWATER 180CM (WIRE) ×2 IMPLANT
WIRE EMERALD 3MM-J .035X150CM (WIRE) ×2 IMPLANT
WIRE HI TORQ VERSACORE-J 145CM (WIRE) ×2 IMPLANT

## 2019-01-23 NOTE — Progress Notes (Signed)
Had 2 more episode of chest pain. Last EKG obtained around 5:10PM showed ST elevation in the inferior leads suggestive of evolving STEMI. Patient seen with Dr. Elease Hashimoto and discussed with STEMI doc Dr. Herbie Baltimore. Code STEMI activated.   Ramond Dial PA Pager: [phone]

## 2019-01-23 NOTE — ED Notes (Signed)
Cardiology at bedside.

## 2019-01-23 NOTE — Progress Notes (Signed)
ANTICOAGULATION CONSULT NOTE - Initial Consult  Pharmacy Consult:  Heparin Indication:  NSTEMI  No Known Allergies  Patient Measurements: Height: 5\' 9"  (175.3 cm) Weight: 175 lb (79.4 kg) IBW/kg (Calculated) : 70.7 Heparin Dosing Weight: 79 kg  Vital Signs: Temp: 97.9 F (36.6 C) (03/22 1110) Temp Source: Oral (03/22 1110) BP: 110/78 (03/22 1200) Pulse Rate: 51 (03/22 1200)  Labs: Recent Labs    01/23/19 1112  HGB 15.8  HCT 46.3  PLT 158  CREATININE 1.30*    Estimated Creatinine Clearance: 51.4 mL/min (A) (by C-G formula based on SCr of 1.3 mg/dL (H)).   Medical History: Past Medical History:  Diagnosis Date  . Ataxia 03/09/2015  . Bell's palsy    left  . CAP (community acquired pneumonia) 07/2018   Noted on noncontrast, low dose CT done for lung ca screening.  . Chronic renal insufficiency, stage 3 (moderate) (HCC) 2019   GFR 50s  . Colon cancer screening 2019   FIT test neg 03/21/18  . Coronary artery disease 01/16/11   nuclear stress test- low risk scan. no ischemia or infarct/scar is seen EF 77%  . Hyperlipidemia    Recommended pt restart atorva 40 mg qd 06/28/18 but he declined, stating he had intol to rosuva and atorva (?cough? per pt).  . Hypertension   . Loss of touch sensation on examination 03/09/2015  . Polyneuropathy 03/2015   NCS/EMG 2016: symmetric length-dependent moderately severe axonal sensorimotor polyneuropathy (no ulnar or median neuropathy, no radiculopathy).  . Stented coronary artery   . Tobacco abuse    Quit 03/2018.  (30+ pack-yr cigs).      Assessment: 35 YOM presented with chest pain to start IV heparin for NSTEMI.  Baseline labs reviewed.  Goal of Therapy:  Heparin level 0.3-0.7 units/ml Monitor platelets by anticoagulation protocol: Yes   Plan:  Heparin 4000 units IV bolus, then Heparin gtt at 950 units/hr Check 8 hr heparin level Daily heparin level and CBC   Gurpreet Mariani D. Laney Potash, PharmD, BCPS, BCCCP 01/23/2019, 12:21 PM

## 2019-01-23 NOTE — ED Notes (Signed)
ED TO INPATIENT HANDOFF REPORT  ED Nurse Name and Phone #:  Leafy Kindle [phone]  S Name/Age/Gender [name] 73 y.o. male Room/Bed: 042C/042C  Code Status   Code Status: Not on file  Home/SNF/Other Home Patient oriented to: self, place, time and situation Is this baseline? Yes   Triage Complete: Triage complete  Chief Complaint CHEST PAIN  Triage Note Pt reports chest pains that have been going on since Friday.    Allergies No Known Allergies  Level of Care/Admitting Diagnosis ED Disposition    ED Disposition Condition Comment   Admit  Hospital Area: [facility] [[id number]]  Level of Care: Telemetry Cardiac [103]  Diagnosis: NSTEMI (non-ST elevated myocardial infarction) [facility]) [[id number]]  Admitting Physician: Sabas Sous  Attending Physician: Vesta Mixer [phone]  Estimated length of stay: past midnight tomorrow  Certification:: I certify this patient will need inpatient services for at least 2 midnights  PT Class (Do Not Modify): Inpatient [101]  PT Acc Code (Do Not Modify): Private [1]       B Medical/Surgery History Past Medical History:  Diagnosis Date  . Ataxia 03/09/2015  . Bell's palsy    left  . CAP (community acquired pneumonia) 07/2018   Noted on noncontrast, low dose CT done for lung ca screening.  . Chronic renal insufficiency, stage 3 (moderate) (HCC) 2019   GFR 50s  . Colon cancer screening 2019   FIT test neg 03/21/18  . Coronary artery disease 01/16/11   nuclear stress test- low risk scan. no ischemia or infarct/scar is seen EF 77%  . Hyperlipidemia    Recommended pt restart atorva 40 mg qd 06/28/18 but he declined, stating he had intol to rosuva and atorva (?cough? per pt).  . Hypertension   . Loss of touch sensation on examination 03/09/2015  . Polyneuropathy 03/2015   NCS/EMG 2016: symmetric length-dependent moderately severe axonal sensorimotor polyneuropathy (no ulnar or median neuropathy, no radiculopathy).   . Stented coronary artery   . Tobacco abuse    Quit 03/2018.  (30+ pack-yr cigs).   Past Surgical History:  Procedure Laterality Date  . CARDIOVASCULAR STRESS TEST  2016   Myocard perf imaging: normal (EF/LV fxn normal, no ischemia)  . COLONOSCOPY  06/2006; 2014   2014 NORMAL.  Recall 10 yrs Deboraha Sprang).  FIT test NEG 03/21/18.  . CORONARY ANGIOPLASTY     RCA stent 1999     A IV Location/Drains/Wounds Patient Lines/Drains/Airways Status   Active Line/Drains/Airways    Name:   Placement date:   Placement time:   Site:   Days:   Peripheral IV 01/23/19 Right Antecubital   01/23/19    1143    Antecubital   less than 1          Intake/Output Last 24 hours  Intake/Output Summary (Last 24 hours) at 01/23/2019 1308 Last data filed at 01/23/2019 1154 Gross per 24 hour  Intake 3 ml  Output -  Net 3 ml    Labs/Imaging Results for orders placed or performed during the hospital encounter of 01/23/19 (from the past 48 hour(s))  Basic metabolic panel     Status: Abnormal   Collection Time: 01/23/19 11:12 AM  Result Value Ref Range   Sodium 139 135 - 145 mmol/L   Potassium 4.4 3.5 - 5.1 mmol/L   Chloride 108 98 - 111 mmol/L   CO2 25 22 - 32 mmol/L   Glucose, Bld 97 70 - 99 mg/dL   BUN 14  8 - 23 mg/dL   Creatinine, Ser 8.54 (H) 0.61 - 1.24 mg/dL   Calcium 9.1 8.9 - 62.7 mg/dL   GFR calc non Af Amer 55 (L) >60 mL/min   GFR calc Af Amer >60 >60 mL/min   Anion gap 6 5 - 15    Comment: Performed at [facility], [address], [place], [place] [address]  CBC     Status: None   Collection Time: 01/23/19 11:12 AM  Result Value Ref Range   WBC 7.8 4.0 - 10.5 K/uL   RBC 5.18 4.22 - 5.81 MIL/uL   Hemoglobin 15.8 13.0 - 17.0 g/dL   HCT 93.8 18.2 - 99.3 %   MCV 89.4 80.0 - 100.0 fL   MCH 30.5 26.0 - 34.0 pg   MCHC 34.1 30.0 - 36.0 g/dL   RDW 71.6 96.7 - 89.3 %   Platelets 158 150 - 400 K/uL   nRBC 0.0 0.0 - 0.2 %    Comment: Performed at [facility], [address]  [place], [place] [address]  I-stat troponin, ED     Status: Abnormal   Collection Time: 01/23/19 11:22 AM  Result Value Ref Range   Troponin i, poc 0.73 (HH) 0.00 - 0.08 ng/mL   Comment NOTIFIED PHYSICIAN    Comment 3            Comment: Due to the release kinetics of cTnI, a negative result within the first hours of the onset of symptoms does not rule out myocardial infarction with certainty. If myocardial infarction is still suspected, repeat the test at appropriate intervals.   Protime-INR     Status: None   Collection Time: 01/23/19 12:41 PM  Result Value Ref Range   Prothrombin Time 13.0 11.4 - 15.2 seconds   INR 1.0 0.8 - 1.2    Comment: (NOTE) INR goal varies based on device and disease states. Performed at [facility], [address] [place], [place] [address]   APTT     Status: None   Collection Time: 01/23/19 12:41 PM  Result Value Ref Range   aPTT 31 24 - 36 seconds    Comment: Performed at [facility], [address], [place], [place] [address]   No results found.  Pending Labs Unresulted Labs (From admission, onward)    Start     Ordered   01/24/19 0500  Heparin level (unfractionated)  Daily,   R     01/23/19 1222   01/24/19 0500  CBC  Daily,   R     01/23/19 1222   01/23/19 2130  Heparin level (unfractionated)  Once-Timed,   R     01/23/19 1222   Signed and Held  Troponin I - Now Then Q6H  Now then every 6 hours,   STAT     Signed and Held   Signed and Held  Hemoglobin A1c  Once,   R     Signed and Held   Signed and Held  Basic metabolic panel  Tomorrow morning,   R     Signed and Held   Signed and Held  Lipid panel  Tomorrow morning,   R     Signed and Held          Vitals/Pain Today's Vitals   01/23/19 1130 01/23/19 1145 01/23/19 1200 01/23/19 1213  BP: 115/71 (!) 112/47 110/78   Pulse: (!) 54 (!) 50 (!) 51   Resp: 14  15  Temp:      TempSrc:      SpO2: 99% 98% 100%   Weight:      Height:      PainSc:    0-No pain     Isolation Precautions No active isolations  Medications Medications  heparin ADULT infusion 100 units/mL (25000 units/220mL sodium chloride 0.45%) (950 Units/hr Intravenous New Bag/Given 01/23/19 1240)  sodium chloride flush (NS) 0.9 % injection 3 mL (3 mLs Intravenous Given 01/23/19 1154)  heparin bolus via infusion 4,000 Units (4,000 Units Intravenous Bolus from Bag 01/23/19 1240)    Mobility walks Low fall risk   Focused Assessments Cardiac Assessment Handoff:  Cardiac Rhythm: Normal sinus rhythm(bradycardiac) No results found for: CKTOTAL, CKMB, CKMBINDEX, TROPONINI No results found for: DDIMER Does the Patient currently have chest pain? No      R Recommendations: See Admitting Provider Note  Report given to:   Additional Notes:  Patient states he only feels chest pain on exertion. Currently having no chest pain.

## 2019-01-23 NOTE — ED Notes (Signed)
Patient transported to X-ray 

## 2019-01-23 NOTE — H&P (Addendum)
Cardiology Admission History and Physical:   Patient ID: Patrick Price MRN: [id number]; DOB: 09/28/1946   Admission date: 01/23/2019  Primary Care Provider: Jeoffrey Massed, MD Primary Cardiologist: Nicki Guadalajara, MD  Primary Electrophysiologist:  None   Chief Complaint:  Chest pain  Patient Profile:   Patrick Price is a 73 y.o. male with PMH of HTN, HLD, former smoker and CAD s/p stent to RCA in 1999 presented with resting chest pain reminiscent of previous angina and found to have elevated troponin  History of Present Illness:   Patrick Price is a 73 year old male with PMH of HTN, HLD, former smoker and CAD s/p stent to RCA in 1999.  He had a negative Myoview in 2014.  Since his last stent placement, he has not had any other cardiac catheterization. He gets annual lung cancer screening.  Low-dose CT scan obtained in January 2020 was negative for lung cancer however does show underlying changes suggestive of COPD. He was in his usual state of health until he started having substernal chest pain radiating to the left jaw and down the left arm 3 days ago.  He had episode of chest discomfort walking around at work on Friday.  He had 2 more episodes on Saturday.  This morning, he was eating breakfast when he had a very mild episode of chest discomfort that lasted about 10 minutes around 9:00.  He went to walk his dog at [facility] around 9:30 when he had a very prolonged episode of chest discomfort that lasted all the way until he arrived at the ED around 10:15.  He has not had any further episode of chest discomfort.  Initial point-of-care troponin was elevated at 0.73.  EKG showed a sinus bradycardia without significant ST-T wave changes.  At this time, he has 0/10 chest pain.  Cardiology consulted for NSTEMI.  Past Medical History:  Diagnosis Date  . Ataxia 03/09/2015  . Bell's palsy    left  . CAP (community acquired pneumonia) 07/2018   Noted on noncontrast, low dose CT done for lung ca  screening.  . Chronic renal insufficiency, stage 3 (moderate) (HCC) 2019   GFR 50s  . Colon cancer screening 2019   FIT test neg 03/21/18  . Coronary artery disease 01/16/11   nuclear stress test- low risk scan. no ischemia or infarct/scar is seen EF 77%  . Hyperlipidemia    Recommended pt restart atorva 40 mg qd 06/28/18 but he declined, stating he had intol to rosuva and atorva (?cough? per pt).  . Hypertension   . Loss of touch sensation on examination 03/09/2015  . Polyneuropathy 03/2015   NCS/EMG 2016: symmetric length-dependent moderately severe axonal sensorimotor polyneuropathy (no ulnar or median neuropathy, no radiculopathy).  . Stented coronary artery   . Tobacco abuse    Quit 03/2018.  (30+ pack-yr cigs).    Past Surgical History:  Procedure Laterality Date  . CARDIOVASCULAR STRESS TEST  2016   Myocard perf imaging: normal (EF/LV fxn normal, no ischemia)  . COLONOSCOPY  06/2006; 2014   2014 NORMAL.  Recall 10 yrs Deboraha Sprang).  FIT test NEG 03/21/18.  . CORONARY ANGIOPLASTY     RCA stent 1999     Medications Prior to Admission: Prior to Admission medications   Medication Sig Start Date End Date Taking? Authorizing Provider  cefdinir (OMNICEF) 300 MG capsule Take 1 capsule (300 mg total) by mouth 2 (two) times daily. 07/16/18   McGowen, Maryjean Morn, MD  triamcinolone cream (KENALOG) 0.1 %  Apply 1 application topically 2 (two) times daily. 06/28/18   McGowen, Maryjean Morn, MD     Allergies:   No Known Allergies  Social History:   Social History   Socioeconomic History  . Marital status: Married    Spouse name: Not on file  . Number of children: 2  . Years of education: 65  . Highest education level: Not on file  Occupational History  . Occupation: Research officer, trade union- unemployed currently   Social Needs  . Financial resource strain: Not on file  . Food insecurity:    Worry: Not on file    Inability: Not on file  . Transportation needs:    Medical: Not on file     Non-medical: Not on file  Tobacco Use  . Smoking status: Former Smoker    Packs/day: 0.10    Years: 50.00    Pack years: 5.00    Types: Cigarettes    Last attempt to quit: 02/15/2007    Years since quitting: 11.9  . Smokeless tobacco: Never Used  Substance and Sexual Activity  . Alcohol use: Yes    Alcohol/week: 0.0 standard drinks    Comment: rare, 3-4 x a yr  . Drug use: No  . Sexual activity: Not on file  Lifestyle  . Physical activity:    Days per week: Not on file    Minutes per session: Not on file  . Stress: Not on file  Relationships  . Social connections:    Talks on phone: Not on file    Gets together: Not on file    Attends religious service: Not on file    Active member of club or organization: Not on file    Attends meetings of clubs or organizations: Not on file    Relationship status: Not on file  . Intimate partner violence:    Fear of current or ex partner: Not on file    Emotionally abused: Not on file    Physically abused: Not on file    Forced sexual activity: Not on file  Other Topics Concern  . Not on file  Social History Narrative   Married, one son and one daughter.   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   50 pack-yr hx.  Quit x 10 yrs, then restarted 2018.   No alcohol.         2-3 cups of caffeine daily    Family History:   The patient's family history includes Heart attack in his mother. There is no history of Cancer or Diabetes.    ROS:  Please see the history of present illness.  All other ROS reviewed and negative.     Physical Exam/Data:   Vitals:   01/23/19 1115 01/23/19 1130 01/23/19 1145 01/23/19 1200  BP: 122/66 115/71 (!) 112/47 110/78  Pulse: (!) 58 (!) 54 (!) 50 (!) 51  Resp: 16 14  15   Temp:      TempSrc:      SpO2: 97% 99% 98% 100%  Weight:      Height:        Intake/Output Summary (Last 24 hours) at 01/23/2019 1251 Last data filed at 01/23/2019 1154 Gross per  24 hour  Intake 3 ml  Output -  Net 3 ml   Last 3 Weights 01/23/2019 07/21/2018 06/28/2018  Weight (lbs) 175 lb 175 lb 8 oz 177 lb 8 oz  Weight (kg) 79.379  kg 79.606 kg 80.513 kg     Body mass index is 25.84 kg/m.  General:  Well nourished, well developed, in no acute distress HEENT: normal Lymph: no adenopathy Neck: no JVD Endocrine:  No thryomegaly Vascular: No carotid bruits; FA pulses 2+ bilaterally without bruits  Cardiac:  normal S1, S2; RRR; no murmur  Lungs:  clear to auscultation bilaterally, no wheezing, rhonchi or rales  Abd: soft, nontender, no hepatomegaly  Ext: no edema Musculoskeletal:  No deformities, BUE and BLE strength normal and equal Skin: warm and dry  Neuro:  CNs 2-12 intact, no focal abnormalities noted Psych:  Normal affect    EKG:  The ECG that was done and was personally reviewed and demonstrates sinus bradycardia without significant ST-T wave changes  Relevant CV Studies:  Myoview 03/24/2013 Impression Exercise Capacity:  Good exercise capacity. BP Response:  Normal blood pressure response. Clinical Symptoms:  No significant symptoms noted. ECG Impression:  Significant ST abnormalities consistent with ischemia. Comparison with Prior Nuclear Study: No significant change from previous study  Overall Impression:  Normal stress nuclear study.  LV Wall Motion:  NL LV Function; NL Wall Motion   Laboratory Data:  Chemistry Recent Labs  Lab 01/23/19 1112  NA 139  K 4.4  CL 108  CO2 25  GLUCOSE 97  BUN 14  CREATININE 1.30*  CALCIUM 9.1  GFRNONAA 55*  GFRAA >60  ANIONGAP 6    No results for input(s): PROT, ALBUMIN, AST, ALT, ALKPHOS, BILITOT in the last 168 hours. Hematology Recent Labs  Lab 01/23/19 1112  WBC 7.8  RBC 5.18  HGB 15.8  HCT 46.3  MCV 89.4  MCH 30.5  MCHC 34.1  RDW 13.4  PLT 158   Cardiac EnzymesNo results for input(s): TROPONINI in the last 168 hours.  Recent Labs  Lab 01/23/19 1122  TROPIPOC 0.73*     BNPNo results for input(s): BNP, PROBNP in the last 168 hours.  DDimer No results for input(s): DDIMER in the last 168 hours.  Radiology/Studies:  No results found.  Assessment and Plan:   1. NSTEMI:   - Intermittent chest discomfort for the past 3 days.    - serial troponin. Obtain echocardiogram.  - plan for cath on Monday. IV heparin. PRN nitro.  No beta-blocker given underlying bradycardia  - Risk and benefit of procedure explained to the patient who display clear understanding and agree to proceed.  Discussed with patient possible procedural risk include bleeding, vascular injury, renal injury, arrythmia, MI, stroke and loss of limb or life.  2. CAD: h/o stent to RCA  3. Hyperlipidemia: restart high dose statin  4. Hypertension: Disappointed history of hypertension, he does not appear to be taking any blood pressure medication at home.  His blood pressure in the emergency room is very much normal.  No beta-blocker given underlying bradycardia  5. Asymptomatic bradycardia: Heart rate in the low 50s despite not on any AV nodal blocking agent.   6. Former tobacco abuse: Recent low-dose CT screening was negative for lung cancer however does suggest of underlying COPD.   Severity of Illness: The appropriate patient status for this patient is INPATIENT. Inpatient status is judged to be reasonable and necessary in order to provide the required intensity of service to ensure the patient's safety. The patient's presenting symptoms, physical exam findings, and initial radiographic and laboratory data in the context of their chronic comorbidities is felt to place them at high risk for further clinical deterioration. Furthermore, it is not  anticipated that the patient will be medically stable for discharge from the hospital within 2 midnights of admission. The following factors support the patient status of inpatient.   " The patient's presenting symptoms include chest pain. " The worrisome  physical exam findings include benign. " The initial radiographic and laboratory data are worrisome because of +troponin. " The chronic co-morbidities include CAD, HTN, HLD, former smoker.   * I certify that at the point of admission it is my clinical judgment that the patient will require inpatient hospital care spanning beyond 2 midnights from the point of admission due to high intensity of service, high risk for further deterioration and high frequency of surveillance required.*    For questions or updates, please contact [facility] Please consult [email or web address] for contact info under        Ramond Dial, [place]  01/23/2019 12:51 PM    Attending Note:   The patient was seen and examined.  Agree with assessment and plan as noted above.  Changes made to the above note as needed.  Patient seen and independently examined with Azalee Course, PA .   We discussed all aspects of the encounter. I agree with the assessment and plan as stated above.     1.  Unstable angina:   Patient presents with symptoms consistent with unstable angina.  He states that his symptoms are exactly the same as his previous episodes of chest pain prior to his coronary stent.  He had come off all of his medications including his statin and his aspirin. He is had stuttering chest pain for the past several days and today had more severe chest pain while taking out the garbage. Pollen levels are elevated as noted above.  EKG shows no acute changes.  He is currently pain-free.  Given his progressive angina symptoms, I think that we should proceed with heart catheterization tomorrow.  We discussed the risks, benefits, options.  He understands and agrees to proceed.  2.  Hyperlipidemia: The patient was previously on a statin but stopped it years ago.  Will restart atorvastatin.    I have spent a total of 40 minutes with patient reviewing hospital  notes , telemetry, EKGs, labs and examining patient as well as  establishing an assessment and plan that was discussed with the patient. > 50% of time was spent in direct patient care.   Vesta Mixer, Montez Hageman., MD, [facility] 01/23/2019, 1:30 PM [address],  [address] Office [phone] Pager [phone]

## 2019-01-23 NOTE — Progress Notes (Signed)
Critical Result - Troponin = 0.87 - Text message sent to [facility], [place].

## 2019-01-23 NOTE — Progress Notes (Addendum)
Patient complained of Chest Pain 5 out of 10.  4L O2 Spink applied.   Patient states chest pain in gone and "I feel great now".  BP=128/70 HR=57 (patient states runs low)  Patient encouraged to wear O2 as needed and to limit activity in room. Patient also encouraged to call RN if pain reoccurs.

## 2019-01-23 NOTE — Progress Notes (Signed)
Pt c/o brief episode of chest pain that resolved spontaneously. EKG done. ST elevation noted. Azalee Course notified.

## 2019-01-23 NOTE — ED Provider Notes (Signed)
Medical screening examination/treatment/procedure(s) were conducted as a shared visit with non-physician practitioner(s) and myself.  I personally evaluated the patient during the encounter.  Clinical Impression:   Final diagnoses:  None    Intermittent CP ove the chest and into the jaw for the last few days -similar to when had prior CAD / stenting in 1999, ASA taken at home - nothing makes this better or worse, has hx of htn and hyperlipidemia - no other meds for it - prior smoker.  No pain at this time.  Stressed in 2016, no cath in > 20 years.  EKG normal  Patient has had symptoms that are definitely concerning for unstable angina given his recent uptake and events over the last several days and then symptoms lasting at least 30 minutes with classic radiation into the jaw and the shoulder today.  We will plan on consultation with cardiology after troponin  11:45 AM, I was informed that the patient's troponin is elevated at 0.73.  We will get another EKG and the patient will need to be admitted to the hospital under the cardiology service for his non-ST elevation MI.  We will start heparin, nitroglycerin.  The patient is critically ill  .Critical Care Performed by: Eber Hong, MD Authorized by: Eber Hong, MD   Critical care provider statement:    Critical care time (minutes):  35   Critical care time was exclusive of:  Separately billable procedures and treating other patients and teaching time   Critical care was necessary to treat or prevent imminent or life-threatening deterioration of the following conditions:  Cardiac failure   Critical care was time spent personally by me on the following activities:  Blood draw for specimens, development of treatment plan with patient or surrogate, discussions with consultants, evaluation of patient's response to treatment, examination of patient, obtaining history from patient or surrogate, ordering and performing treatments and  interventions, ordering and review of laboratory studies, ordering and review of radiographic studies, pulse oximetry, re-evaluation of patient's condition and review of old charts      EKG Interpretation  Date/Time:  Sunday January 23 2019 11:07:41 EDT Ventricular Rate:  59 PR Interval:    QRS Duration: 92 QT Interval:  379 QTC Calculation: 376 R Axis:   28 Text Interpretation:  Sinus rhythm since 2016, no changes seen Confirmed by Eber Hong ([id number]) on 01/23/2019 11:10:41 AM         Eber Hong, MD 01/24/19 [phone]

## 2019-01-23 NOTE — ED Triage Notes (Signed)
Pt reports chest pains that have been going on since Friday.

## 2019-01-23 NOTE — ED Notes (Signed)
Pt is pain free but reports his pain was a 10 prior to arrival.

## 2019-01-23 NOTE — Interval H&P Note (Signed)
History and Physical Interval Note:  Had 2 more episode of chest pain. Last EKG obtained around 5:10PM showed ST elevation in the inferior leads suggestive of evolving STEMI. Patient seen with Dr. Elease Hashimoto and discussed with STEMI doc Dr. Herbie Baltimore. Code STEMI activated.   Patrick Dial PA Pager: [phone]  01/23/2019 6:07 PM  Patrick Price  has presented today for surgery, with the diagnosis of STEMI.    The patient was admitted as a non-ST elevation MI, however he has been having intermittent chest pain off and on despite being on IV heparin.  Most recent episode of chest pain was associated with ST elevations in II, 3 and aVF that was subtle, but consistent with possible STEMI.  This is likely consistent with opening and closing vessel with intermittent occlusion.  He will be taken to the Cath Lab as a STEMI, however he is currently chest pain-free.  The various methods of treatment have been discussed with the patient and family. After consideration of risks, benefits and other options for treatment, the patient has consented to  Procedure(s): Coronary/Graft Acute MI Revascularization (N/A) LEFT HEART CATH AND CORONARY ANGIOGRAPHY (N/A) with possible PERCUTANEOUS CORONARY INTERVENTION as a surgical intervention.    The patient's history has been reviewed, patient examined, no change in status, stable for surgery.  I have reviewed the patient's chart and labs.  Questions were answered to the patient's satisfaction.     Bryan Lemma

## 2019-01-23 NOTE — Progress Notes (Signed)
Pt with a change in ST elevations, Dr. Herbie Baltimore and Wynema Birch PA to bedside. Placed 18g SL LPFA. Transported to cath lab on zoll with Shanda Bumps RN

## 2019-01-23 NOTE — Plan of Care (Signed)

## 2019-01-23 NOTE — ED Provider Notes (Signed)
[facility] EMERGENCY DEPARTMENT Provider Note   CSN: [id number] Arrival date & time: 01/23/19  1053    History   Chief Complaint Chief Complaint  Patient presents with   Chest Pain    HPI Vincient Schirra is a 73 y.o. male with a PMH of CAD, HTN, and HLD presenting with intermittent chest pain across chest radiating to left arm and left jaw onset 3 days ago. Patient states he had the chest pain while sitting and drinking his coffee this morning and then it resolved. Patient had another episode of chest pain while walking in the park with his wife. Patient describes chest pain as pressure and states it is similar to the chest pain he had when he had to have a stent placed 20 years ago. Patient denies chest pain currently and states last episode occurred at 10:30am today. Patient reports he had one episode on Friday, two episodes on Saturday, and two episodes today. Patient states episodes last 20 minutes and resolve on their own. Patient states he took ASA this morning. Patient states nothing makes chest pain better or worse. Patient denies fever, cough, chills, abdominal pain, cough, congestion, DOE, SOB, nausea, or diaphoresis. Patient denies recent travel, recent surgery, leg edema/pain, or hx of DVT/PE. Patient reports he last has Dr. Tresa Endo, his cardiologist, 1 year ago. Patient reports he quit smoking 12 years ago. Patient denies alcohol or drug use. Patient reports he is very active and walks 2-3 miles per day. Last stress test was in 2016 and it reveals normal LVEF and no ischemia. Patient reports last cath was done when he had an RCA stent in 1999.     HPI  Past Medical History:  Diagnosis Date   Ataxia 03/09/2015   Bell's palsy    left   CAP (community acquired pneumonia) 07/2018   Noted on noncontrast, low dose CT done for lung ca screening.   Chronic renal insufficiency, stage 3 (moderate) (HCC) 2019   GFR 50s   Colon cancer screening 2019   FIT test  neg 03/21/18   Coronary artery disease 01/16/11   nuclear stress test- low risk scan. no ischemia or infarct/scar is seen EF 77%   Hyperlipidemia    Recommended pt restart atorva 40 mg qd 06/28/18 but he declined, stating he had intol to rosuva and atorva (?cough? per pt).   Hypertension    Loss of touch sensation on examination 03/09/2015   Polyneuropathy 03/2015   NCS/EMG 2016: symmetric length-dependent moderately severe axonal sensorimotor polyneuropathy (no ulnar or median neuropathy, no radiculopathy).   Stented coronary artery    Tobacco abuse    Quit 03/2018.  (30+ pack-yr cigs).    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  Recall 10 yrs Deboraha Sprang).  FIT test NEG 03/21/18.   CORONARY ANGIOPLASTY     RCA stent  1999        Home Medications    Prior to Admission medications   Medication Sig Start Date End Date Taking? Authorizing Provider  cefdinir (OMNICEF) 300 MG capsule Take 1 capsule (300 mg total) by mouth 2 (two) times daily. 07/16/18   McGowen, Maryjean Morn, MD  triamcinolone cream (KENALOG) 0.1 % Apply 1 application topically 2 (two) times daily. 06/28/18   McGowen, Maryjean Morn, MD    Family History Family History  Problem Relation Age of Onset   Heart attack Mother    Cancer Neg Hx    Diabetes Neg Hx      Social History Social History   Tobacco Use   Smoking status: Former Smoker    Packs/day: 0.10    Years: 50.00    Pack years: 5.00    Types: Cigarettes    Last attempt to quit: 02/15/2007    Years since quitting: 11.9   Smokeless tobacco: Never Used  Substance Use Topics   Alcohol use: Yes    Alcohol/week: 0.0 standard drinks    Comment: rare, 3-4 x a yr   Drug use: No     Allergies   Patient has no known allergies.   Review of Systems Review of Systems  Constitutional: Negative for activity change, appetite change, chills, diaphoresis, fatigue, fever and unexpected weight change.  HENT: Negative for congestion and rhinorrhea.   Eyes: Negative for visual disturbance.  Respiratory: Negative for cough, chest tightness, shortness of breath and wheezing.   Cardiovascular: Positive for chest pain. Negative for palpitations and leg swelling.  Gastrointestinal: Negative for abdominal pain, nausea and vomiting.  Endocrine: Negative for cold intolerance and heat intolerance.  Musculoskeletal: Negative for back pain.  Skin: Negative for rash.  Neurological: Negative for dizziness, syncope, weakness and light-headedness.  Psychiatric/Behavioral: Negative for agitation and behavioral problems. The patient is not nervous/anxious.    Physical Exam Updated Vital Signs BP 110/78    Pulse (!) 51    Temp 97.9 F (36.6 C) (Oral)    Resp 15    Ht 5\' 9"  (1.753 m)    Wt 79.4 kg    SpO2 100%    BMI 25.84 kg/m   Physical Exam Vitals signs and nursing note reviewed.  Constitutional:      General: He is not in acute distress.    Appearance: He is well-developed. He is not diaphoretic.  HENT:     Head: Normocephalic and atraumatic.  Neck:     Musculoskeletal: Normal range of motion and neck supple.     Vascular: No JVD.  Cardiovascular:     Rate and Rhythm: Regular rhythm. Bradycardia present.     Pulses: Normal pulses.          Radial pulses are 2+ on the right side and 2+ on  the left side.       Dorsalis pedis pulses are 2+ on the right side and 2+ on the left side.     Heart sounds: Normal heart sounds. No murmur. No friction rub. No gallop.   Pulmonary:     Effort: Pulmonary effort is normal. No respiratory distress.     Breath sounds: Normal breath sounds. No wheezing or rales.  Chest:     Chest wall: No tenderness.  Abdominal:     Palpations: Abdomen is soft.     Tenderness: There is no abdominal tenderness.  Musculoskeletal: Normal range of motion.     Right lower leg: He exhibits no tenderness. No edema.  Left lower leg: He exhibits no tenderness. No edema.  Skin:    Capillary Refill: Capillary refill takes less than 2 seconds.     Coloration: Skin is not pale.     Findings: No rash.  Neurological:     Mental Status: He is alert.    ED Treatments / Results  Labs (all labs ordered are listed, but only abnormal results are displayed) Labs Reviewed  BASIC METABOLIC PANEL - Abnormal; Notable for the following components:      Result Value   Creatinine, Ser 1.30 (*)    GFR calc non Af Amer 55 (*)    All other components within normal limits  I-STAT TROPONIN, ED - Abnormal; Notable for the following components:   Troponin i, poc 0.73 (*)    All other components within normal limits  CBC  HEPARIN LEVEL (UNFRACTIONATED)  PROTIME-INR  APTT    EKG EKG Interpretation  Date/Time:  Sunday January 23 2019 11:07:41 EDT Ventricular Rate:  59 PR Interval:    QRS Duration: 92 QT Interval:  379 QTC Calculation: 376 R Axis:   28 Text Interpretation:  Sinus rhythm since 2016, no changes seen Confirmed by Eber Hong ([id number]) on 01/23/2019 11:10:41 AM   Radiology No results found.  Procedures Procedures (including critical care time)  Medications Ordered in ED Medications  heparin ADULT infusion 100 units/mL (25000 units/231mL sodium chloride 0.45%) (950 Units/hr Intravenous New Bag/Given 01/23/19 1240)  sodium chloride flush (NS) 0.9 %  injection 3 mL (3 mLs Intravenous Given 01/23/19 1154)  heparin bolus via infusion 4,000 Units (4,000 Units Intravenous Bolus from Bag 01/23/19 1240)     Initial Impression / Assessment and Plan / ED Course  I have reviewed the triage vital signs and the nursing notes.  Pertinent labs & imaging results that were available during my care of the patient were reviewed by me and considered in my medical decision making (see chart for details).       Concern for cardiac etiology of Chest Pain. No acute changes noted on EKG. Elevated troponin noted at 0.73. Started heparin. Patient took ASA prior to arrival. Patient denies chest pain currently. Cardiology has been consulted. Discussed case with cardiology and cardiology has agreed to evaluate patient. This case was discussed with Dr. Hyacinth Meeker who has seen the patient and agrees with plan to admit.   CRITICAL CARE Performed by: Mahealani Sulak Tally Due  ?  Total critical care time: 33 minutes  Critical care time was exclusive of separately billable procedures and treating other patients.  Critical care was necessary to treat or prevent imminent or life-threatening deterioration.  Critical care was time spent personally by me on the following activities: development of treatment plan with patient and/or surrogate as well as nursing, discussions with consultants, evaluation of patient's response to treatment, examination of patient, obtaining history from patient or surrogate, ordering and performing treatments and interventions, ordering and review of laboratory studies, ordering and review of radiographic studies, pulse oximetry and re-evaluation of patient's condition.  Final Clinical Impressions(s) / ED Diagnoses   Final diagnoses:  NSTEMI (non-ST elevated myocardial infarction) [facility])    ED Discharge Orders    None       Leretha Dykes, [place] 01/23/19 1330    Eber Hong, MD 01/24/19 [phone]

## 2019-01-23 NOTE — ED Notes (Signed)
Critical lab, troponin 0.73, EDP aware.

## 2019-01-23 NOTE — ED Notes (Signed)
Back from scan. 

## 2019-01-24 ENCOUNTER — Encounter (HOSPITAL_COMMUNITY)
Admission: EM | Disposition: A | Discharge status: Home / Self Care | Payer: Self-pay | Source: Home / Self Care | Attending: Cardiovascular Disease

## 2019-01-24 ENCOUNTER — Encounter (HOSPITAL_COMMUNITY): Payer: Self-pay | Admitting: Cardiology

## 2019-01-24 DIAGNOSIS — E785 Hyperlipidemia, unspecified: Secondary | ICD-10-CM

## 2019-01-24 DIAGNOSIS — I2119 ST elevation (STEMI) myocardial infarction involving other coronary artery of inferior wall: Principal | ICD-10-CM

## 2019-01-24 DIAGNOSIS — R030 Elevated blood-pressure reading, without diagnosis of hypertension: Secondary | ICD-10-CM

## 2019-01-24 LAB — LIPID PANEL
Cholesterol: 234 mg/dL — ABNORMAL HIGH (ref 0–200)
HDL: 35 mg/dL — ABNORMAL LOW (ref >40)
LDL Cholesterol: 163 mg/dL — ABNORMAL HIGH (ref 0–99)
Total CHOL/HDL Ratio: 6.7 RATIO
Triglycerides: 180 mg/dL — ABNORMAL HIGH (ref <150)
VLDL: 36 mg/dL (ref 0–40)

## 2019-01-24 LAB — CBC
HCT: 42.6 % (ref 39.0–52.0)
Hemoglobin: 14.5 g/dL (ref 13.0–17.0)
MCH: 30.1 pg (ref 26.0–34.0)
MCHC: 34 g/dL (ref 30.0–36.0)
MCV: 88.4 fL (ref 80.0–100.0)
Platelets: 137 10*3/uL — ABNORMAL LOW (ref 150–400)
RBC: 4.82 MIL/uL (ref 4.22–5.81)
RDW: 13.6 % (ref 11.5–15.5)
WBC: 7.5 10*3/uL (ref 4.0–10.5)
nRBC: 0 % (ref 0.0–0.2)

## 2019-01-24 LAB — BASIC METABOLIC PANEL
Anion gap: 8 (ref 5–15)
BUN: 14 mg/dL (ref 8–23)
CO2: 21 mmol/L — ABNORMAL LOW (ref 22–32)
Calcium: 8.3 mg/dL — ABNORMAL LOW (ref 8.9–10.3)
Chloride: 109 mmol/L (ref 98–111)
Creatinine, Ser: 1.11 mg/dL (ref 0.61–1.24)
GFR calc Af Amer: >60 mL/min (ref >60)
GFR calc non Af Amer: >60 mL/min (ref >60)
Glucose, Bld: 96 mg/dL (ref 70–99)
Potassium: 3.7 mmol/L (ref 3.5–5.1)
Sodium: 138 mmol/L (ref 135–145)

## 2019-01-24 LAB — POCT ACTIVATED CLOTTING TIME
Activated Clotting Time: 147 seconds
Activated Clotting Time: 235 seconds
Activated Clotting Time: 279 seconds
Activated Clotting Time: 285 seconds

## 2019-01-24 LAB — TROPONIN I: Troponin I: 1.58 ng/mL (ref <0.03)

## 2019-01-24 SURGERY — LEFT HEART CATH AND CORONARY ANGIOGRAPHY
Anesthesia: LOCAL

## 2019-01-24 MED ORDER — LOSARTAN POTASSIUM 25 MG PO TABS
25.0000 mg | ORAL_TABLET | Freq: Every day | ORAL | Status: DC
  Filled 2019-01-24: qty 1

## 2019-01-24 MED ORDER — ATORVASTATIN CALCIUM 80 MG PO TABS: 80.0000 mg | ORAL_TABLET | Freq: Every day | ORAL | 6 refills | Status: DC

## 2019-01-24 MED ORDER — NITROGLYCERIN 0.4 MG SL SUBL: 0.4000 mg | SUBLINGUAL_TABLET | SUBLINGUAL | 12 refills | Status: DC | PRN

## 2019-01-24 MED ORDER — TICAGRELOR 90 MG PO TABS: 90.0000 mg | ORAL_TABLET | Freq: Two times a day (BID) | ORAL | 11 refills | Status: DC

## 2019-01-24 MED ORDER — TICAGRELOR 90 MG PO TABS: 90.0000 mg | ORAL_TABLET | Freq: Two times a day (BID) | ORAL | 0 refills | Status: DC

## 2019-01-24 MED ORDER — HYDROCODONE-ACETAMINOPHEN 7.5-325 MG PO TABS
1.0000 | ORAL_TABLET | Freq: Once | ORAL | Status: AC
  Administered 2019-01-24: 1 via ORAL
  Filled 2019-01-24: qty 1

## 2019-01-24 MED ORDER — ASPIRIN 81 MG PO TBEC: 81.0000 mg | DELAYED_RELEASE_TABLET | Freq: Every day | ORAL | 3 refills | Status: AC

## 2019-01-24 MED ORDER — ATROPINE SULFATE 1 MG/10ML IJ SOSY
PREFILLED_SYRINGE | INTRAMUSCULAR | Status: AC
  Filled 2019-01-24: qty 10

## 2019-01-24 MED FILL — ATORVASTATIN CALCIUM 80 MG: 80 | 30 days supply | Qty: 30 | Fill #0

## 2019-01-24 MED FILL — NITROGLYCERIN 0.4 MG TAB SL: 0.4 | 8 days supply | Qty: 25 | Fill #0

## 2019-01-24 MED FILL — BRILINTA 90 MG TABLET: 90 | 30 days supply | Qty: 60 | Fill #0

## 2019-01-24 MED FILL — ASPIRIN LOW DOSE 81 MG TBEC: 81 | 90 days supply | Qty: 90 | Fill #0

## 2019-01-24 NOTE — Progress Notes (Signed)
Patient's troponin of 1.12 was reported to Dr. Liane Comber, MD. Per Dr. Liane Comber MD, do not report repeat troponins unless patient is having chest pain.

## 2019-01-24 NOTE — Progress Notes (Signed)
Progress Note  Patient Name: Patrick Price Date of Encounter: 01/24/2019  Primary Cardiologist: Nicki Guadalajara, MD   Subjective   Asymptomatic this morning.  No difficulty.  No bleeding from radial or femoral cath sites.  Has elevated diastolic blood pressure.  Inpatient Medications    Scheduled Meds: . aspirin  324 mg Oral NOW   Or  . aspirin  300 mg Rectal NOW  . aspirin EC  81 mg Oral Daily  . atorvastatin  80 mg Oral q1800  . sodium chloride flush  3 mL Intravenous Q12H  . ticagrelor  90 mg Oral BID   Continuous Infusions: . sodium chloride     PRN Meds: sodium chloride, acetaminophen, nitroGLYCERIN, ondansetron (ZOFRAN) IV, sodium chloride flush   Vital Signs    Vitals:   01/24/19 0630 01/24/19 0700 01/24/19 0730 01/24/19 0800  BP:  130/73 129/70 (!) 131/103  Pulse: (!) 56 (!) 55 (!) 56 62  Resp: (!) 27 12 19 19   Temp:   97.8 F (36.6 C)   TempSrc:   Oral   SpO2: 95% 99% 99% 100%  Weight:      Height:        Intake/Output Summary (Last 24 hours) at 01/24/2019 0921 Last data filed at 01/24/2019 0500 Gross per 24 hour  Intake 1228.31 ml  Output 800 ml  Net 428.31 ml   Filed Weights   01/23/19 1103 01/23/19 1342  Weight: 79.4 kg 76.3 kg    Telemetry    Normal sinus rhythm- Personally Reviewed  ECG    Performed on 01/24/2019 reveals sinus bradycardia but otherwise unremarkable.- Personally Reviewed  Physical Exam  Appears healthy GEN: No acute distress.   Neck: No JVD Cardiac: RRR, no murmurs, rubs, or gallops.  Right femoral cath site is soft.  Right radial area demonstrates ecchymosis but otherwise unremarkable. Respiratory: Clear to auscultation bilaterally. GI: Soft, nontender, non-distended  MS: No edema; No deformity. Neuro:  Nonfocal  Psych: Normal affect   Labs    Chemistry Recent Labs  Lab 01/23/19 1112 01/24/19 0133  NA 139 138  K 4.4 3.7  CL 108 109  CO2 25 21*  GLUCOSE 97 96  BUN 14 14  CREATININE 1.30* 1.11  CALCIUM  9.1 8.3*  GFRNONAA 55* >60  GFRAA >60 >60  ANIONGAP 6 8     Hematology Recent Labs  Lab 01/23/19 1112 01/24/19 0133  WBC 7.8 7.5  RBC 5.18 4.82  HGB 15.8 14.5  HCT 46.3 42.6  MCV 89.4 88.4  MCH 30.5 30.1  MCHC 34.1 34.0  RDW 13.4 13.6  PLT 158 137*    Cardiac Enzymes Recent Labs  Lab 01/23/19 1456 01/23/19 2022 01/24/19 0133  TROPONINI 0.87* 1.12* 1.58*    Recent Labs  Lab 01/23/19 1122  TROPIPOC 0.73*     BNPNo results for input(s): BNP, PROBNP in the last 168 hours.   DDimer No results for input(s): DDIMER in the last 168 hours.   Radiology    Dg Chest 2 View  Result Date: 01/23/2019 CLINICAL DATA:  Left-sided chest pain radiating to left mandible and left arm 2 days. EXAM: CHEST - 2 VIEW COMPARISON:  02/25/2015 FINDINGS: Lungs are adequately inflated without focal airspace consolidation or effusion. Cardiomediastinal silhouette and remainder of the exam is unchanged. IMPRESSION: No active cardiopulmonary disease. Electronically Signed   By: Elberta Fortis M.D.   On: 01/23/2019 13:17    Cardiac Studies   Cardiac catheterization and PCI 01/23/2019:  Diagnostic  Dominance: Right    Intervention       Patient Profile     73 y.o. male PMH of HTN, HLD, former smoker and CAD s/p stent to RCA in 1999 presented with resting chest pain reminiscent of previous angina and found to have elevated troponin.  Underwent successful to site PCI under urgent circumstances last evening and has had an unremarkable course since that time.  Assessment & Plan    1. Non-ST elevation acute coronary syndrome with culprit vessel, RCA, intervention including placement of a proximal and distal stent.  Resolution of symptoms is occurred.  If he ambulates well with the team this morning and blood pressure is under control, will discharge later today.  Add low-dose angiotensin receptor blockade.  Unable to use beta-blocker therapy because of slow heart rate. 2. Hyperlipidemia LDL  163, requires aggressive management.  High intensity statin therapy has been started.  Triglyceride is elevated and may benefit from VASCEPA initiation as an outpatient. 3. Mildly elevated blood pressure.  Will add low-dose ARB for cardioprotection.  Plan to discharge later today, proximally 2 to 3 hours after losartan assuming blood pressure tolerates therapy without significant problem.  Needs repeat basic metabolic panel in 7 to 10 days.  Needs a liver and lipid panel in 6 to 8 weeks.  For questions or updates, please contact [facility] Please consult [email or web address] for contact info under Cardiology/STEMI.      Signed, Lesleigh Noe, MD  01/24/2019, 9:21 AM

## 2019-01-24 NOTE — Progress Notes (Signed)
Patient initially told this writer that Lipitor gave him leg cramps, however, he meant to say Crestor. Per patient, his physician switched him from Crestor to Lipitor. Dr. Liane Comber MD, was called and notified. Will keep patient on his scheduled Lipitor.

## 2019-01-24 NOTE — TOC Benefit Eligibility Note (Signed)
Transition of Care [facility]) Benefit Eligibility Note    Patient Details  Name: Patrick Price MRN: [id number] Date of Birth: 05-23-1946   Medication/Dose: BRILINTA 90 MG BID  Covered?: Yes  Tier: 3 Drug  Prescription Coverage Preferred Pharmacy: [facility] : Erline Hau with Person/Company/Phone Number:: SAVANNA  @  [employer] # [phone]  Co-Pay: $ 47.00  Prior Approval: Kathlee Nations Phone Number: 01/24/2019, 11:53 AM

## 2019-01-24 NOTE — Progress Notes (Signed)
Patient out of bed this morning and ambulated around the unit. No complaints of CP, or SOB during ambulation. Right groin cath site remains soft with dry dressing intact. Right radial cath site remains soft with dry dressing intact.

## 2019-01-24 NOTE — Progress Notes (Signed)
Right femoral sheath removed at 0010 after ACT of 147. Initially level 0. Manual pressure held for 20 minutes. BP and HR stable throughout. Small hematoma formed at the start but easily softened with pressure. Level 1 at 0030. Patient educated on potential complications, holding pressure when bearing down and sneezing, and to call for help if bleeding occurs.

## 2019-01-24 NOTE — Progress Notes (Signed)
1000-1100 Pt had walked twice with staff 370 ft without CP or weakness so did not walk with pt.  Reviewed MI restrictions, NTG use, importance of brilinta with stent, ex ed, heart healthy food choices, and CRP 2.  Pt voiced understanding. Dicussed CRP 2 and referred to [facility] program. Needs to see case manager for brilinta card. Luetta Nutting RN BSN 01/24/2019 10:55 AM

## 2019-01-24 NOTE — Care Management (Signed)
Brilinta benefits check sent and pending.  Layanna Charo RN, BSN, NCM-BC, ACM-RN [phone] 

## 2019-01-24 NOTE — Discharge Summary (Addendum)
The patient has been seen in conjunction with Vin Bhagat, PAC. All aspects of care have been considered and discussed. The patient has been personally interviewed, examined, and all clinical data has been reviewed.   Ambulated without difficulty.  No chest discomfort.  No access site bleeding or bruising.  Heart rate did not allow addition of beta-blocker.  Low-dose ARB was refused by the patient.  He is being discharged on high intensity statin therapy, dual antiplatelet therapy (aspirin and Brilinta), and should potentially be considered for the VASCEPA.  Plan TOC with Dr. Elijah Birk Kelly/team.    Discharge Summary    Patient ID: Patrick Price MRN: [id number]; DOB: 06/29/1946  Admit date: 01/23/2019 Discharge date: 01/24/2019  Primary Care Provider: Jeoffrey Massed, MD  Primary Cardiologist: Nicki Guadalajara, MD   Discharge Diagnoses    Principal Problem:   Acute ST elevation myocardial infarction (STEMI) of inferior wall [facility]) Active Problems:   Stented coronary artery   CAD S/P percutaneous coronary angioplasty   NSTEMI (non-ST elevated myocardial infarction) (HCC)   Allergies No Known Allergies  Diagnostic Studies/Procedures    Coronary/Graft Acute MI Revascularization  LEFT HEART CATH AND CORONARY ANGIOGRAPHY  Conclusion     Culprit Lesion #1: Prox RCA-1 lesion is 99% stenosed just proximal to prior stent. Prox RCA-2 previously placed bare-metal stent (BMS) is 30% stenosed.  A drug-eluting stent was successfully placed covering the lesion and the prior BMS stent using a STENT RESOLUTE ONYX 2.5X18. Postdilated 2.85 mm  Post intervention, there is a 0% residual stenosis.  Lesion #2: Dist RCA lesion is 75% stenosed.  A drug-eluting stent was successfully placed using a STENT RESOLUTE ONYX 2.5X15. Postdilated to 2.8 mm  Post intervention, there is a 0% residual stenosis.  Prox RCA to Mid RCA lesion is 30% stenosed. Distal to the new  stent  -------------------------------------  Prox LAD lesion is 40% stenosed. Between 1st & 2nd Diag branches  The Left Ventricular Systolic Function is normal, with an estimated Left Ventricular EF is 50-55% by visual estimate. LV end diastolic pressure is mildly elevated.  A drug-eluting stent was successfully placed using a STENT RESOLUTE ONYX 2.5X15.  A drug-eluting stent was successfully placed using a STENT RESOLUTE ONYX 2.5X18.   SUMMARY  Severe single-vessel disease with 2 lesions in the proximal and distal RCA treated with 2 Resolute Onyx DES stents (2.5 mm x 15 mm distal, 2.5 mm x 18 mm proximal covering prior stent -post postdilated to 2.8 mm)  Minimal disease elsewhere.  Preserved LVEF of 50 to 55% with inferior hypokinesis and moderately elevated LVEDP.  RECOMMENDATIONS  The patient be transferred to the CCU for overnight monitoring, but potentially be discharged as early as tomorrow.  Continue aggressive risk factor modification  Dual antiplatelet therapy per recommendations - minimum 1 yr.   Diagnostic  Dominance: Right    Intervention       History of Present Illness     Patrick Price is a 73 y.o. male with PMH of HTN, HLD, former smoker and CAD s/p stent to RCA in 1999 presented with resting chest pain reminiscent of previous angina and found to have elevated troponin.   He had a negative Myoview in 2014.  Since his last stent placement, he has not had any other cardiac catheterization.  He was in his usual state of health until he started having substernal chest pain radiating to the left jaw and down the left arm 3 days ago prior to presentation. He  was eating breakfast when he had a very mild episode of chest discomfort that lasted about 10 minutes around 9:00.  He went to walk his dog at [facility] around 9:30 when he had a very prolonged episode of chest discomfort that lasted all the way until he arrived at the ED around 10:15. He has not had  any further episode of chest discomfort.  Initial point-of-care troponin was elevated at 0.73.  EKG showed a sinus bradycardia without significant ST-T wave changes.   Hospital Course     Consultants: None  Started on IV heparin and taken to cath lab and found to have severe single-vessel disease with 2 lesions in the proximal and distal RCA treated with 2 Resolute Onyx DES stents (2.5 mm x 15 mm distal, 2.5 mm x 18 mm proximal covering prior stent -post postdilated to 2.8 mm). Minimal disease elsewhere. Preserved LVEF of 50 to 55% with inferior hypokinesis and moderately elevated LVEDP. Recommendations given for DAPT for minimum of 1 year. Ambulated well without recurrent symptoms.  Unable to use beta-blocker therapy because of slow heart rate. Intermittent minimally elevated BP. Added Losartan 25mg  daily however patient refused to take it. He will monitor his BP at home and will bring reading to review during follow up. 01/24/2019: Cholesterol 234; HDL 35; LDL Cholesterol 163; Triglycerides 180; VLDL 36. Started on Lipitor 80mg  daily. Advised to get LFTs and lipid panel in 6-8 weeks. May benefit from VASCEPA initiation as an outpatient.  The patient been seen by Dr. Katrinka Blazing today and deemed ready for discharge home. All follow-up appointments have been scheduled. Discharge medications are listed below.   Discharge Vitals Blood pressure 110/60, pulse (!) 59, temperature 97.8 F (36.6 C), temperature source Oral, resp. rate 19, height 5\' 8"  (1.727 m), weight 76.3 kg, SpO2 99 %.  Filed Weights   01/23/19 1103 01/23/19 1342  Weight: 79.4 kg 76.3 kg    Labs & Radiologic Studies    CBC Recent Labs    01/23/19 1112 01/24/19 0133  WBC 7.8 7.5  HGB 15.8 14.5  HCT 46.3 42.6  MCV 89.4 88.4  PLT 158 137*   Basic Metabolic Panel Recent Labs    62/22/97 1112 01/24/19 0133  NA 139 138  K 4.4 3.7  CL 108 109  CO2 25 21*  GLUCOSE 97 96  BUN 14 14  CREATININE 1.30* 1.11  CALCIUM 9.1 8.3*    Cardiac Enzymes Recent Labs    01/23/19 1456 01/23/19 2022 01/24/19 0133  TROPONINI 0.87* 1.12* 1.58*   Hemoglobin A1C Recent Labs    01/23/19 1456  HGBA1C 5.0   Fasting Lipid Panel Recent Labs    01/24/19 0133  CHOL 234*  HDL 35*  LDLCALC 163*  TRIG 180*  CHOLHDL 6.7   _____________  Dg Chest 2 View  Result Date: 01/23/2019 CLINICAL DATA:  Left-sided chest pain radiating to left mandible and left arm 2 days. EXAM: CHEST - 2 VIEW COMPARISON:  02/25/2015 FINDINGS: Lungs are adequately inflated without focal airspace consolidation or effusion. Cardiomediastinal silhouette and remainder of the exam is unchanged. IMPRESSION: No active cardiopulmonary disease. Electronically Signed   By: Elberta Fortis M.D.   On: 01/23/2019 13:17   Disposition   Pt is being discharged home today in good condition.  Follow-up Plans & Appointments    Follow-up Information    Azalee Course, [place]. Go on 02/11/2019.   Specialties:  Cardiology, Radiology Why:  @2pm  for hospital follow up  Contact information: [address] [place] [address] [phone]          Discharge Instructions    Amb Referral to Cardiac Rehabilitation   Complete by:  As directed    Diagnosis:   Coronary Stents NSTEMI     Diet - low sodium heart healthy   Complete by:  As directed    Discharge instructions   Complete by:  As directed    NO HEAVY LIFTING (>10lbs) X 2 WEEKS. NO SEXUAL ACTIVITY X 2 WEEKS. NO DRIVING X 1 WEEK. NO SOAKING BATHS, HOT TUBS, POOLS, ETC., X 7 DAYS.   Increase activity slowly   Complete by:  As directed       Discharge Medications   Allergies as of 01/24/2019   No Known Allergies     Medication List    STOP taking these medications   cefdinir 300 MG capsule Commonly known as:  OMNICEF   doxylamine (Sleep) 25 MG tablet Commonly known as:  UNISOM   triamcinolone cream 0.1 % Commonly known as:  KENALOG     TAKE these medications   aspirin 81 MG EC  tablet Take 1 tablet (81 mg total) by mouth daily. Start taking on:  January 25, 2019   atorvastatin 80 MG tablet Commonly known as:  LIPITOR Take 1 tablet (80 mg total) by mouth daily at 6 PM.   nitroGLYCERIN 0.4 MG SL tablet Commonly known as:  NITROSTAT Place 1 tablet (0.4 mg total) under the tongue every 5 (five) minutes x 3 doses as needed for chest pain.   ticagrelor 90 MG Tabs tablet Commonly known as:  BRILINTA Take 1 tablet (90 mg total) by mouth 2 (two) times daily.   ticagrelor 90 MG Tabs tablet Commonly known as:  BRILINTA Take 1 tablet (90 mg total) by mouth 2 (two) times daily. Start taking on:  February 24, 2019        Acute coronary syndrome (MI, NSTEMI, STEMI, etc) this admission?: Yes.     AHA/ACC Clinical Performance & Quality Measures: 6. Aspirin prescribed? - Yes 7. ADP Receptor Inhibitor (Plavix/Clopidogrel, Brilinta/Ticagrelor or Effient/Prasugrel) prescribed (includes medically managed patients)? - Yes 8. Beta Blocker prescribed? - No - due to bradycardia 9. High Intensity Statin (Lipitor 40-80mg  or Crestor 20-40mg ) prescribed? - Yes 10. EF assessed during THIS hospitalization? - Yes 11. For EF <40%, was ACEI/ARB prescribed? - Not Applicable (EF >/= 40%) plan to add as outpatient 12. For EF <40%, Aldosterone Antagonist (Spironolactone or Eplerenone) prescribed? - Not Applicable (EF >/= 40%) 13. Cardiac Rehab Phase II ordered (Included Medically managed Patients)? - Yes     Outstanding Labs/Studies   Consider OP f/u labs 6-8 weeks given statin initiation this admission.  Duration of Discharge Encounter   Greater than 30 minutes including physician time.  Lorelei Pont, PA 01/24/2019, 12:13 PM

## 2019-01-24 NOTE — Progress Notes (Signed)
Patient's TR Band was removed from his right radial cath site. Manual pressure was held for 20 minutes with hemostasis achieved. Palpable  pulse is noted in radial site with perfusion in digits. Will continue to monitor.

## 2019-01-25 ENCOUNTER — Encounter: Payer: Self-pay | Admitting: Family Medicine

## 2019-01-27 ENCOUNTER — Telehealth (HOSPITAL_COMMUNITY): Payer: Self-pay

## 2019-01-27 NOTE — Telephone Encounter (Signed)
Called pt to see if he was interested in participating in the cardiac rehab program pt stated he was not interested that he does his on exercises at home. Closed referral. Tempie Donning. Support Rep II

## 2019-01-27 NOTE — Telephone Encounter (Signed)
Pt insurance is active and benefits verified through [facility] Medicare Co-pay $20.00, DED 0/0 met, out of pocket $3,600/$110 met, co-insurance 0. no pre-authorization required. Passport, 01/26/2019 @ 2:41pm, REF# [id number]  Will contact patient to see if he is interested in the Cardiac Rehab Program. If interested, patient will need to complete follow up appt. Once completed, patient will be contacted for scheduling upon review by the RN Navigator. Tedra Senegal. Support Rep II

## 2019-02-09 ENCOUNTER — Telehealth: Payer: Self-pay | Admitting: Physician Assistant

## 2019-02-09 NOTE — Telephone Encounter (Signed)
Patient appointment type and time has been change

## 2019-02-09 NOTE — Telephone Encounter (Signed)
Discussed with the patient, will transition to televisit this Friday at 11:30AM. Will ask our CMA to arrange.

## 2019-02-11 ENCOUNTER — Telehealth: Payer: Self-pay

## 2019-02-11 ENCOUNTER — Telehealth (INDEPENDENT_AMBULATORY_CARE_PROVIDER_SITE_OTHER): Payer: Medicare Other | Admitting: Physician Assistant

## 2019-02-11 ENCOUNTER — Telehealth: Payer: Medicare Other | Admitting: Physician Assistant

## 2019-02-11 ENCOUNTER — Encounter: Payer: Self-pay | Admitting: Physician Assistant

## 2019-02-11 VITALS — Ht 69.0 in | Wt 168.0 lb

## 2019-02-11 DIAGNOSIS — Z87891 Personal history of nicotine dependence: Secondary | ICD-10-CM

## 2019-02-11 DIAGNOSIS — I2510 Atherosclerotic heart disease of native coronary artery without angina pectoris: Secondary | ICD-10-CM

## 2019-02-11 DIAGNOSIS — E785 Hyperlipidemia, unspecified: Secondary | ICD-10-CM

## 2019-02-11 NOTE — Patient Instructions (Addendum)
Medication Instructions:   Your physician recommends that you continue on your current medications as directed. Please refer to the Current Medication list given to you today.  If you need a refill on your cardiac medications before your next appointment, please call your pharmacy.   Lab work: You will need to come into our office in 2 months for labs (blood work): LIPID  LIVER  If you have labs (blood work) drawn today and your tests are completely normal, you will receive your results only by: [name] MyChart Message (if you have MyChart) OR . A paper copy in the mail If you have any lab test that is abnormal or we need to change your treatment, we will call you to review the results.  Testing/Procedures:  NONE ordered at this time of appointment   Follow-Up: At [facility], you and your health needs are our priority.  As part of our continuing mission to provide you with exceptional heart care, we have created designated Provider Care Teams.  These Care Teams include your primary Cardiologist (physician) and Advanced Practice Providers (APPs -  Physician Assistants and Nurse Practitioners) who all work together to provide you with the care you need, when you need it. You will need a follow up appointment in 2-3 months.  Please call our office 2 months in advance to schedule this appointment.  You may see Nicki Guadalajara, MD or one of the following Advanced Practice Providers on your designated Care Team:   Theodore Demark, PA-C . Joni Reining, DNP, ANP  Any Other Special Instructions Will Be Listed Below (If Applicable).

## 2019-02-11 NOTE — Progress Notes (Signed)
Virtual Visit via Telephone Note   This visit type was conducted due to national recommendations for restrictions regarding the COVID-19 Pandemic (e.g. social distancing) in an effort to limit this patient's exposure and mitigate transmission in our community.  Due to his co-morbid illnesses, this patient is at least at moderate risk for complications without adequate follow up.  This format is felt to be most appropriate for this patient at this time.  The patient did not have access to video technology/had technical difficulties with video requiring transitioning to audio format only (telephone).  All issues noted in this document were discussed and addressed.  No physical exam could be performed with this format.  Please refer to the patient's chart for his  consent to telehealth for Patrick Price.   Evaluation Performed:  Follow-up visit  Date:  02/11/2019   ID:  Patrick Price, DOB 07/19/1946, MRN [id number]  Patient Location: Home  Provider Location: Home  PCP:  Patrick Massed, MD  Cardiologist:  Patrick Guadalajara, MD  Electrophysiologist:  None   Chief Complaint:  Hospital followup  History of Present Illness:    Patrick Price is a 73 y.o. male who presents via audio/video conferencing for a telehealth visit today.    Patrick Price is a 73 year old male with past medical history of HLD, former smoker and CAD s/p stent to RCA in 1999.  He had a negative Myoview in 2014.  Low-dose CT scan obtained in January 2020 was negative for lung cancer but does show underlying changes suggestive of COPD.  Patient was initially admitted as NSTEMI however in the afternoon of admission, patient had recurrent chest pain and EKG changes suggestive of evolving STEMI.  He was taken urgently to the [facility] which revealed a 99% proximal RCA lesion treated with DES, 75% distal RCA lesion also treated with resolute Onyx 2.5 x 15 mm DES.  EF was 50 to 55% by LV gram.  Prior to discharge, losartan was added to  his medical regimen, however patient refused.  Since discharge, he has been doing very well without recurrent chest pain.  He adamantly denies he ever have history of hypertension.  Otherwise he has been compliant with the current medications.  Emphasis has been placed on compliance with dual antiplatelet therapy.  Otherwise he has no lower extremity edema, orthopnea or PND.  He can follow-up in the clinic in 3 months.  Prior to that, he will need a fasting lipid panel and LFT in 2 months.  The patient does not have symptoms concerning for COVID-19 infection (fever, chills, cough, or new shortness of breath).    Past Medical History:  Diagnosis Date   Ataxia 03/09/2015   Bell's palsy    left   CAD S/P BMS PCI-RCA 1999; 01/2019   a) 1999 - BMS PCI RCA (in [place]. Followed by Patrick Price); [id number]: nuclear stress test- low risk scan. no ischemia or infarct/scar is seen EF 77%. c) 2 Resolute Onyx DES stents to RCA, EF 55-60%, inf hypokin, elev LVEDP   CAP (community acquired pneumonia) 07/2018   Noted on noncontrast, low dose CT done for lung ca screening.   Chronic renal insufficiency, stage 3 (moderate) (Patrick Price) 2019   GFR 50s   Colon cancer screening 2019   FIT test neg 03/21/18   Hyperlipidemia    Recommended pt restart atorva 40 mg qd 06/28/18 but he declined, stating he had intol to rosuva and atorva (?cough? per pt).   Hypertension  Loss of touch sensation on examination 03/09/2015   Polyneuropathy 03/2015   NCS/EMG 2016: symmetric length-dependent moderately severe axonal sensorimotor polyneuropathy (no ulnar or median neuropathy, no radiculopathy).   STEMI (ST elevation myocardial infarction) (Patrick Price) 01/2019   2 Resolute Onyx DES stents to RCA: ASA + plavix, statin.  (HR too low for BB, plus pt refused ARB).   Tobacco abuse    Quit 03/2018.  (30+ pack-yr cigs).   Past Surgical History:  Procedure Laterality Date   CARDIOVASCULAR STRESS TEST  2016   Myocard perf imaging:  normal (EF/LV fxn normal, no ischemia)   COLONOSCOPY  06/2006; 2014   2014 NORMAL.  Recall 10 yrs Patrick Price([facility]).  FIT test NEG 03/21/18.   CORONARY ANGIOPLASTY WITH STENT PLACEMENT  1999   RCA stent 1999 Patrick Price(Patrick Price)   CORONARY/GRAFT ACUTE MI REVASCULARIZATION N/A 01/23/2019   2 Resolute Onyx DES stents to RCA. Procedure: Coronary/Graft Acute MI Revascularization;  Surgeon: Patrick Price, Patrick W, MD;  Location: [facility];  Service: Cardiovascular;  Laterality: N/A;   LEFT HEART CATH AND CORONARY ANGIOGRAPHY N/A 01/23/2019   2 Resolute Onyx DES stents to RCA. Procedure: LEFT HEART CATH AND CORONARY ANGIOGRAPHY;  Surgeon: Patrick Price, Patrick W, MD;  Location: [facility];  Service: Cardiovascular;  Laterality: N/A;     Current Meds  Medication Sig   aspirin EC 81 MG EC tablet Take 1 tablet (81 mg total) by mouth daily.   atorvastatin (LIPITOR) 80 MG tablet Take 1 tablet (80 mg total) by mouth daily at 6 PM.   ticagrelor (BRILINTA) 90 MG TABS tablet Take 1 tablet (90 mg total) by mouth 2 (two) times daily.     Allergies:   Patient has no known allergies.   Social History   Tobacco Use   Smoking status: Former Smoker    Packs/day: 0.10    Years: 50.00    Pack years: 5.00    Types: Cigarettes    Last attempt to quit: 02/15/2007    Years since quitting: 11.9   Smokeless tobacco: Never Used  Substance Use Topics   Alcohol use: Yes    Alcohol/week: 0.0 standard drinks    Comment: rare, 3-4 x a yr   Drug use: No     Family Hx: The patient's family history includes Heart attack in his mother. There is no history of Cancer or Diabetes.  ROS:   Please see the history of present illness.     All other systems reviewed and are negative.   Prior CV studies:   The following studies were reviewed today:  Cath 01/23/2019  Culprit Lesion #1: Prox RCA-1 lesion is 99% stenosed just proximal to prior stent. Prox RCA-2 previously placed bare-metal stent (BMS) is 30% stenosed.  A  drug-eluting stent was successfully placed covering the lesion and the prior BMS stent using a STENT RESOLUTE ONYX 2.5X18. Postdilated 2.85 mm  Post intervention, there is a 0% residual stenosis.  Lesion #2: Dist RCA lesion is 75% stenosed.  A drug-eluting stent was successfully placed using a STENT RESOLUTE ONYX 2.5X15. Postdilated to 2.8 mm  Post intervention, there is a 0% residual stenosis.  Prox RCA to Mid RCA lesion is 30% stenosed. Distal to the new stent  -------------------------------------  Prox LAD lesion is 40% stenosed. Between 1st & 2nd Diag branches  The Left Ventricular Systolic Function is normal, with an estimated Left Ventricular EF is 50-55% by visual estimate. LV end diastolic pressure is mildly elevated.  A drug-eluting stent  was successfully placed using a STENT RESOLUTE ONYX 2.5X15.  A drug-eluting stent was successfully placed using a STENT RESOLUTE ONYX 2.5X18.   SUMMARY  Severe single-vessel disease with 2 lesions in the proximal and distal RCA treated with 2 Resolute Onyx DES stents (2.5 mm x 15 mm distal, 2.5 mm x 18 mm proximal covering prior stent -post postdilated to 2.8 mm)  Minimal disease elsewhere.  Preserved LVEF of 50 to 55% with inferior hypokinesis and moderately elevated LVEDP.  RECOMMENDATIONS  The patient be transferred to the CCU for overnight monitoring, but potentially be discharged as early as tomorrow.  Continue aggressive risk factor modification  Dual antiplatelet therapy per recommendations - minimum 1 yr.  Labs/Other Tests and Data Reviewed:    EKG:  An ECG dated 01/24/2019 was personally reviewed today and demonstrated:  Sinus bradycardia with T wave inversion in the inferior leads.  Recent Labs: 06/28/2018: ALT 15 01/24/2019: BUN 14; Creatinine, Ser 1.11; Hemoglobin 14.5; Platelets 137; Potassium 3.7; Sodium 138   Recent Lipid Panel Price Results  Component Value Date/Time   CHOL 234 (H) 01/24/2019 01:33 AM   TRIG  180 (H) 01/24/2019 01:33 AM   HDL 35 (L) 01/24/2019 01:33 AM   CHOLHDL 6.7 01/24/2019 01:33 AM   LDLCALC 163 (H) 01/24/2019 01:33 AM   LDLDIRECT 127.0 06/28/2018 08:46 AM    Wt Readings from Last 3 Encounters:  02/11/19 168 lb (76.2 kg)  01/23/19 168 lb 4.8 oz (76.3 kg)  07/21/18 175 lb 8 oz (79.6 kg)     Objective:    Vital Signs:  Ht 5\' 9"  (1.753 m)    Wt 168 lb (76.2 kg)    BMI 24.81 kg/m    Well nourished, well developed male in no acute distress.   ASSESSMENT & PLAN:    1. CAD: Compliant with aspirin and Brilinta.  Unable to be placed on beta-blocker due to baseline bradycardia.  He refused to be placed on losartan in the hospital as he adamantly denies ever having history of hypertension.  2. Hyperlipidemia: LDL was 163 in the hospital.  Continue high-dose statin.  Fasting lipid panel and LFT in 6 to 8 weeks.  3. Former tobacco use: Quit 20 years ago  COVID-19 Education: The signs and symptoms of COVID-19 were discussed with the patient and how to seek care for testing (follow up with PCP or arrange E-visit).  The importance of social distancing was discussed today.  Time:   Today, I have spent 6 minutes with the patient with telehealth technology discussing the above problems.     Medication Adjustments/Labs and Tests Ordered: Current medicines are reviewed at length with the patient today.  Concerns regarding medicines are outlined above.  Tests Ordered: Orders Placed This Encounter  Procedures   Hepatic function panel   Lipid panel   Medication Changes: No orders of the defined types were placed in this encounter.   Disposition:  Follow up in 3 month(s)  Signed, Azalee Course, PA  02/11/2019 4:10 PM    [facility]

## 2019-02-11 NOTE — Telephone Encounter (Signed)
Virtual Visit Pre-Appointment Phone Call  Steps For Call:  1. Confirm consent - "In the setting of the current Covid19 crisis, you are scheduled for a (phone or video) visit with your provider on (date) at (time).  Just as we do with many in-office visits, in order for you to participate in this visit, we must obtain consent.  If you'd like, I can send this to your mychart (if signed up) or email for you to review.  Otherwise, I can obtain your verbal consent now.  All virtual visits are billed to your insurance company just like a normal visit would be.  By agreeing to a virtual visit, we'd like you to understand that the technology does not allow for your provider to perform an examination, and thus may limit your provider's ability to fully assess your condition.  Finally, though the technology is pretty good, we cannot assure that it will always work on either your or our end, and in the setting of a video visit, we may have to convert it to a phone-only visit.  In either situation, we cannot ensure that we have a secure connection.  Are you willing to proceed?"  2. Give patient instructions for WebEx download to smartphone as below if video visit  3. Advise patient to be prepared with any vital sign or heart rhythm information, their current medicines, and a piece of paper and pen handy for any instructions they may receive the day of their visit  4. Inform patient they will receive a phone call 15 minutes prior to their appointment time (may be from unknown caller ID) so they should be prepared to answer  5. Confirm that appointment type is correct in Epic appointment notes (video vs telephone)    TELEPHONE CALL NOTE  Patrick Price has been deemed a candidate for a follow-up tele-health visit to limit community exposure during the Covid-19 pandemic. I spoke with the patient via phone to ensure availability of phone/video source, confirm preferred email & phone number, and discuss  instructions and expectations.  I reminded Patrick Price to be prepared with any vital sign and/or heart rhythm information that could potentially be obtained via home monitoring, at the time of his visit. I reminded Patrick Price to expect a phone call at the time of his visit if his visit.  Did the patient verbally acknowledge consent to treatment? YES  Dorris Fetch, CMA 02/11/2019 11:12 AM   DOWNLOADING THE WEBEX SOFTWARE TO SMARTPHONE  - If Apple, go to [employer] and type in WebEx in the search bar. Download [employer], the blue/green circle. The app is free but as with any other app downloads, their phone may require them to verify saved payment information or Apple password. The patient does NOT have to create an account.  - If Android, ask patient to go to [employer] and type in WebEx in the search bar. Download [employer], the blue/green circle. The app is free but as with any other app downloads, their phone may require them to verify saved payment information or Android password. The patient does NOT have to create an account.   CONSENT FOR TELE-HEALTH VISIT - PLEASE REVIEW  I hereby voluntarily request, consent and authorize [facility] and its employed or contracted physicians, physician assistants, nurse practitioners or other licensed health care professionals (the Practitioner), to provide me with telemedicine health care services (the "Services") as deemed necessary by the treating Practitioner. I  acknowledge and consent to receive the Services by the Practitioner via telemedicine. I understand that the telemedicine visit will involve communicating with the Practitioner through live audiovisual communication technology and the disclosure of certain medical information by electronic transmission. I acknowledge that I have been given the opportunity to request an in-person assessment or other available alternative prior to the telemedicine visit and  am voluntarily participating in the telemedicine visit.  I understand that I have the Price to withhold or withdraw my consent to the use of telemedicine in the course of my care at any time, without affecting my Price to future care or treatment, and that the Practitioner or I may terminate the telemedicine visit at any time. I understand that I have the Price to inspect all information obtained and/or recorded in the course of the telemedicine visit and may receive copies of available information for a reasonable fee.  I understand that some of the potential risks of receiving the Services via telemedicine include:  [name] Delay or interruption in medical evaluation due to technological equipment failure or disruption; . Information transmitted may not be sufficient (e.g. poor resolution of images) to allow for appropriate medical decision making by the Practitioner; and/or  . In rare instances, security protocols could fail, causing a breach of personal health information.  Furthermore, I acknowledge that it is my responsibility to provide information about my medical history, conditions and care that is complete and accurate to the best of my ability. I acknowledge that Practitioner's advice, recommendations, and/or decision may be based on factors not within their control, such as incomplete or inaccurate data provided by me or distortions of diagnostic images or specimens that may result from electronic transmissions. I understand that the practice of medicine is not an exact science and that Practitioner makes no warranties or guarantees regarding treatment outcomes. I acknowledge that I will receive a copy of this consent concurrently upon execution via email to the email address I last provided but may also request a printed copy by calling the office of [facility] [place].    I understand that my insurance will be billed for this visit.   I have read or had this consent read to me. . I understand the  contents of this consent, which adequately explains the benefits and risks of the Services being provided via telemedicine.  . I have been provided ample opportunity to ask questions regarding this consent and the Services and have had my questions answered to my satisfaction. . I give my informed consent for the services to be provided through the use of telemedicine in my medical care  By participating in this telemedicine visit I agree to the above.

## 2019-02-13 ENCOUNTER — Encounter: Payer: Self-pay | Admitting: Family Medicine

## 2019-02-21 ENCOUNTER — Other Ambulatory Visit: Payer: Self-pay

## 2019-02-21 ENCOUNTER — Telehealth: Payer: Self-pay

## 2019-02-21 ENCOUNTER — Other Ambulatory Visit: Payer: Self-pay | Admitting: Physician Assistant

## 2019-02-21 MED ORDER — NITROGLYCERIN 0.4 MG SL SUBL: 0.4000 mg | SUBLINGUAL_TABLET | SUBLINGUAL | 3 refills | Status: DC | PRN

## 2019-02-21 MED ORDER — ATORVASTATIN CALCIUM 80 MG PO TABS: 80.0000 mg | ORAL_TABLET | Freq: Every day | ORAL | 6 refills | Status: DC

## 2019-02-21 MED ORDER — TICAGRELOR 90 MG PO TABS: 90.0000 mg | ORAL_TABLET | Freq: Two times a day (BID) | ORAL | 6 refills | Status: DC

## 2019-02-21 MED ORDER — TICAGRELOR 90 MG PO TABS: 90.0000 mg | ORAL_TABLET | Freq: Two times a day (BID) | ORAL | 3 refills | Status: DC

## 2019-02-21 MED ORDER — ATORVASTATIN CALCIUM 80 MG PO TABS: 80.0000 mg | ORAL_TABLET | Freq: Every day | ORAL | 3 refills | Status: DC

## 2019-02-21 NOTE — Telephone Encounter (Signed)
Left a detailed  voice message for the patient about his medications being sent to the wrong pharmacy on his discharge from the hospital and that they are now waiting for him to pick at his correct pharmacy.

## 2019-06-30 ENCOUNTER — Encounter: Payer: Medicare Other | Admitting: Family Medicine

## 2019-07-09 DIAGNOSIS — R509 Fever, unspecified: Secondary | ICD-10-CM | POA: Diagnosis not present

## 2019-07-09 DIAGNOSIS — Z03818 Encounter for observation for suspected exposure to other biological agents ruled out: Secondary | ICD-10-CM | POA: Diagnosis not present

## 2019-07-29 ENCOUNTER — Encounter: Payer: Self-pay | Admitting: Family Medicine

## 2019-07-29 ENCOUNTER — Other Ambulatory Visit: Payer: Self-pay

## 2019-07-29 ENCOUNTER — Ambulatory Visit (INDEPENDENT_AMBULATORY_CARE_PROVIDER_SITE_OTHER): Payer: Medicare Other | Admitting: Family Medicine

## 2019-07-29 VITALS — BP 110/69 | HR 59 | Temp 97.8°F | Resp 16 | Ht 67.0 in | Wt 165.5 lb

## 2019-07-29 DIAGNOSIS — E7800 Pure hypercholesterolemia, unspecified: Secondary | ICD-10-CM

## 2019-07-29 DIAGNOSIS — I2510 Atherosclerotic heart disease of native coronary artery without angina pectoris: Secondary | ICD-10-CM

## 2019-07-29 DIAGNOSIS — Z0000 Encounter for general adult medical examination without abnormal findings: Secondary | ICD-10-CM

## 2019-07-29 DIAGNOSIS — I10 Essential (primary) hypertension: Secondary | ICD-10-CM

## 2019-07-29 DIAGNOSIS — Z23 Encounter for immunization: Secondary | ICD-10-CM

## 2019-07-29 DIAGNOSIS — E663 Overweight: Secondary | ICD-10-CM | POA: Diagnosis not present

## 2019-07-29 DIAGNOSIS — Z1211 Encounter for screening for malignant neoplasm of colon: Secondary | ICD-10-CM

## 2019-07-29 DIAGNOSIS — I2583 Coronary atherosclerosis due to lipid rich plaque: Secondary | ICD-10-CM

## 2019-07-29 NOTE — Patient Instructions (Signed)

## 2019-07-29 NOTE — Progress Notes (Signed)
OFFICE VISIT  07/29/2019   CC:  Chief Complaint  Patient presents with  . Annual Exam    not fasting. wants flu shot   HPI:    Patient is a 73 y.o. Caucasian male who presents for annual health maintenance exam.  Had covid 19 infection about 3 wks ago, lasted about 5d, has felt excellent since. Feeling well. He has been working on better diet/eating less. He walks 3-5 miles per day.  Past Medical History:  Diagnosis Date  . Ataxia 03/09/2015  . Bell's palsy    left  . CAD S/P BMS PCI-RCA 1999; 01/2019   a) 1999 - BMS PCI RCA (in [place]. Followed by Dr. Tresa Endo); [id number]: nuclear stress test- low risk scan. no ischemia or infarct/scar is seen EF 77%. c) Mar 2020->2 Resolute Onyx DES stents to RCA, EF 55-60%, inf hypokin, elev LVEDP  . CAP (community acquired pneumonia) 07/2018   Noted on noncontrast, low dose CT done for lung ca screening.  . Chronic renal insufficiency, stage 3 (moderate) (HCC) 2019   GFR 50s  . Colon cancer screening 2019   FIT test neg 03/21/18  . Hyperlipidemia    Recommended pt restart atorva 40 mg qd 06/28/18 but he declined, stating he had intol to rosuva and atorva (?cough? per pt).  . Hypertension    pt adamantly denies this dx  . Loss of touch sensation on examination 03/09/2015  . Polyneuropathy 03/2015   NCS/EMG 2016: symmetric length-dependent moderately severe axonal sensorimotor polyneuropathy (no ulnar or median neuropathy, no radiculopathy).  . STEMI (ST elevation myocardial infarction) (HCC) 01/2019   2 Resolute Onyx DES stents to RCA: ASA + plavix, statin.  (HR too low for BB, plus pt refused ARB).  . Tobacco abuse    Quit 03/2018.  (30+ pack-yr cigs).    Past Surgical History:  Procedure Laterality Date  . CARDIOVASCULAR STRESS TEST  2016   Myocard perf imaging: normal (EF/LV fxn normal, no ischemia)  . COLONOSCOPY  06/2006; 2014   2014 NORMAL.  Recall 10 yrs Deboraha Sprang).  FIT test NEG 03/21/18.  . CORONARY ANGIOPLASTY WITH STENT PLACEMENT   1999   RCA stent 1999 Claris Gower)  . CORONARY/GRAFT ACUTE MI REVASCULARIZATION N/A 01/23/2019   2 Resolute Onyx DES stents to RCA. Procedure: Coronary/Graft Acute MI Revascularization;  Surgeon: Marykay Lex, MD;  [facility]: [facility]VASIVE CV LAB;  Service: Cardiovascular;  Laterality: N/A;  . LEFT HEART CATH AND CORONARY ANGIOGRAPHY N/A 01/23/2019   2 Resolute Onyx DES stents to RCA. Procedure: LEFT HEART CATH AND CORONARY ANGIOGRAPHY;  Surgeon: Marykay Lex, MD;  Location: [facility] INVASIVE CV LAB;  Service: Cardiovascular;  Laterality: N/A;    Outpatient Medications Prior to Visit  Medication Sig Dispense Refill  . aspirin EC 81 MG EC tablet Take 1 tablet (81 mg total) by mouth daily. 90 tablet 3  . atorvastatin (LIPITOR) 80 MG tablet Take 1 tablet (80 mg total) by mouth daily at 6 PM. 90 tablet 3  . ticagrelor (BRILINTA) 90 MG TABS tablet Take 1 tablet (90 mg total) by mouth 2 (two) times daily. 180 tablet 3  . nitroGLYCERIN (NITROSTAT) 0.4 MG SL tablet Place 1 tablet (0.4 mg total) under the tongue every 5 (five) minutes x 3 doses as needed for chest pain. (Patient not taking: Reported on 07/29/2019) 25 tablet 3   No facility-administered medications prior to visit.     No Known Allergies  ROS As per HPI  PE: Blood pressure 110/69, pulse [name])  59, temperature 97.8 F (36.6 C), temperature source Temporal, resp. rate 16, height 5\' 7"  (1.702 m), weight 165 lb 8 oz (75.1 kg), SpO2 98 %. Body mass index is 25.92 kg/m.  Gen: Alert, well appearing.  Patient is oriented to person, place, time, and situation. AFFECT: pleasant, lucid thought and speech. ENT: Ears: EACs clear, normal epithelium.  TMs with good light reflex and landmarks bilaterally.  Eyes: no injection, icteris, swelling, or exudate.  EOMI, PERRLA. Nose: no drainage or turbinate edema/swelling.  No injection or focal lesion.  Mouth: lips without lesion/swelling.  Oral mucosa pink and moist.  Dentition intact and without obvious  caries or gingival swelling.  Oropharynx without erythema, exudate, or swelling.  Neck: supple/nontender.  No LAD, mass, or TM.  Carotid pulses 2+ bilaterally, without bruits. CV: RRR, no m/r/g.   LUNGS: CTA bilat, nonlabored resps, good aeration in all lung fields. ABD: soft, NT, ND, BS normal.  No hepatospenomegaly or mass.  No bruits. EXT: no clubbing, cyanosis, or edema.  Musculoskeletal: no joint swelling, erythema, warmth, or tenderness.  ROM of all joints intact. Skin - no sores or suspicious lesions or rashes or color changes    LABS:  Lab Results  Component Value Date   TSH 0.939 02/25/2015   Lab Results  Component Value Date   WBC 7.5 01/24/2019   HGB 14.5 01/24/2019   HCT 42.6 01/24/2019   MCV 88.4 01/24/2019   PLT 137 (L) 01/24/2019   Lab Results  Component Value Date   CREATININE 1.11 01/24/2019   BUN 14 01/24/2019   NA 138 01/24/2019   K 3.7 01/24/2019   CL 109 01/24/2019   CO2 21 (L) 01/24/2019   Lab Results  Component Value Date   ALT 15 06/28/2018   AST 17 06/28/2018   ALKPHOS 75 06/28/2018   BILITOT 0.6 06/28/2018   Lab Results  Component Value Date   CHOL 234 (H) 01/24/2019   Lab Results  Component Value Date   HDL 35 (L) 01/24/2019   Lab Results  Component Value Date   LDLCALC 163 (H) 01/24/2019   Lab Results  Component Value Date   TRIG 180 (H) 01/24/2019   Lab Results  Component Value Date   CHOLHDL 6.7 01/24/2019   Lab Results  Component Value Date   PSA 0.24 02/25/2015   Lab Results  Component Value Date   HGBA1C 5.0 01/23/2019    IMPRESSION AND PLAN:  Health maintenance exam: Reviewed age and gender appropriate health maintenance issues (prudent diet, regular exercise, health risks of tobacco and excessive alcohol, use of seatbelts, fire alarms in home, use of sunscreen).  Also reviewed age and gender appropriate health screening as well as vaccine recommendations. Vaccines: Flu ->given today.   Pneumovax 23->given  today.   Shingrix rx sent to [facility] b/c no pharmacy had any in stock last year. Labs: FLP, CBC, CMET--lab appt made for 08/04/19. Prostate ca screening: no further prostate screening. Colon ca screening: hx of normal colonoscopy.  FIT normal 03/2018-->repeat FIT today. Lung ca screening: next screening CT due 11/2019.  An After Visit Summary was printed and given to the patient.  FOLLOW UP: No follow-ups on file.  Signed:  Crissie Sickles, MD           07/29/2019

## 2019-08-04 ENCOUNTER — Other Ambulatory Visit: Payer: Self-pay

## 2019-08-04 ENCOUNTER — Ambulatory Visit (INDEPENDENT_AMBULATORY_CARE_PROVIDER_SITE_OTHER): Payer: Medicare Other | Admitting: Family Medicine

## 2019-08-04 DIAGNOSIS — E7800 Pure hypercholesterolemia, unspecified: Secondary | ICD-10-CM | POA: Diagnosis not present

## 2019-08-04 DIAGNOSIS — I10 Essential (primary) hypertension: Secondary | ICD-10-CM

## 2019-08-04 DIAGNOSIS — I2510 Atherosclerotic heart disease of native coronary artery without angina pectoris: Secondary | ICD-10-CM

## 2019-08-04 DIAGNOSIS — I2583 Coronary atherosclerosis due to lipid rich plaque: Secondary | ICD-10-CM

## 2019-08-04 LAB — CBC WITH DIFFERENTIAL/PLATELET
Basophils Absolute: 0.1 10*3/uL (ref 0.0–0.1)
Basophils Relative: 0.8 % (ref 0.0–3.0)
Eosinophils Absolute: 0.1 10*3/uL (ref 0.0–0.7)
Eosinophils Relative: 1.7 % (ref 0.0–5.0)
HCT: 44 % (ref 39.0–52.0)
Hemoglobin: 14.7 g/dL (ref 13.0–17.0)
Lymphocytes Relative: 24.4 % (ref 12.0–46.0)
Lymphs Abs: 1.7 10*3/uL (ref 0.7–4.0)
MCHC: 33.5 g/dL (ref 30.0–36.0)
MCV: 89.5 fl (ref 78.0–100.0)
Monocytes Absolute: 0.6 10*3/uL (ref 0.1–1.0)
Monocytes Relative: 9.1 % (ref 3.0–12.0)
Neutro Abs: 4.4 10*3/uL (ref 1.4–7.7)
Neutrophils Relative %: 64 % (ref 43.0–77.0)
Platelets: 159 10*3/uL (ref 150.0–400.0)
RBC: 4.92 Mil/uL (ref 4.22–5.81)
RDW: 14.6 % (ref 11.5–15.5)
WBC: 6.9 10*3/uL (ref 4.0–10.5)

## 2019-08-04 LAB — COMPREHENSIVE METABOLIC PANEL
ALT: 24 U/L (ref 0–53)
AST: 25 U/L (ref 0–37)
Albumin: 4.1 g/dL (ref 3.5–5.2)
Alkaline Phosphatase: 90 U/L (ref 39–117)
BUN: 19 mg/dL (ref 6–23)
CO2: 27 mEq/L (ref 19–32)
Calcium: 9.4 mg/dL (ref 8.4–10.5)
Chloride: 107 mEq/L (ref 96–112)
Creatinine, Ser: 1.11 mg/dL (ref 0.40–1.50)
GFR: 64.94 mL/min (ref >60.00)
Glucose, Bld: 88 mg/dL (ref 70–99)
Potassium: 4.2 mEq/L (ref 3.5–5.1)
Sodium: 141 mEq/L (ref 135–145)
Total Bilirubin: 0.8 mg/dL (ref 0.2–1.2)
Total Protein: 6.8 g/dL (ref 6.0–8.3)

## 2019-08-04 LAB — LIPID PANEL
Cholesterol: 135 mg/dL (ref 0–200)
HDL: 35.3 mg/dL — ABNORMAL LOW (ref >39.00)
LDL Cholesterol: 71 mg/dL (ref 0–99)
NonHDL: 99.73
Total CHOL/HDL Ratio: 4
Triglycerides: 145 mg/dL (ref 0.0–149.0)
VLDL: 29 mg/dL (ref 0.0–40.0)

## 2019-08-19 ENCOUNTER — Encounter: Payer: Self-pay | Admitting: Family Medicine

## 2019-08-29 IMAGING — CT CT CHEST LUNG CANCER SCREENING LOW DOSE W/O CM
2 of 5 series · 15 of 40 positions shown, 18 images · non-contrast
Comparison: Low-dose lung cancer screening chest CT 07/15/2018.

CLINICAL DATA: 72-year-old male former smoker (quit in 9666) with
43 pack-year history of smoking. Prior abnormal lung cancer
screening examination was nondiagnostic. Followup study.

EXAM:
CT CHEST WITHOUT CONTRAST LOW-DOSE FOR LUNG CANCER SCREENING
TECHNIQUE: Multidetector CT imaging of the chest was performed following the
standard protocol without IV contrast.

[Series 2: lung 1.00 br60 · axial · 0.62mm/px · z∈[-927,-661]mm · 12 of 295 slices shown, 15 images]
[im 15/295  mediastinal]
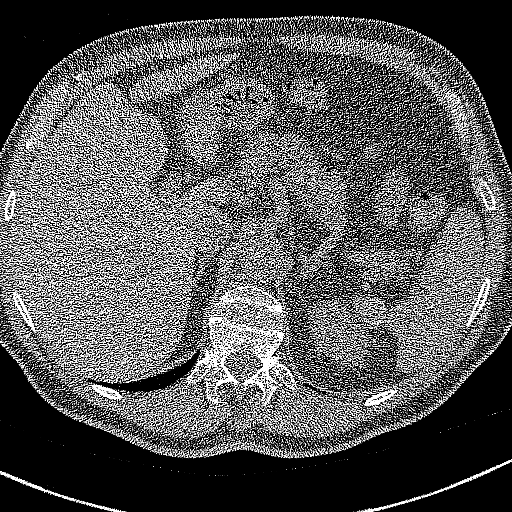
[im 15/295  lung]
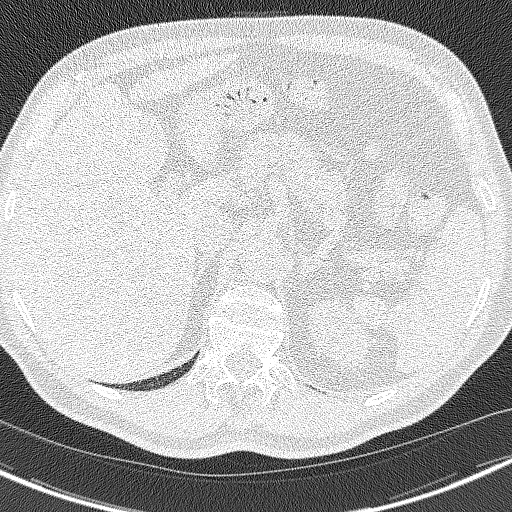
[im 43/295  lung]
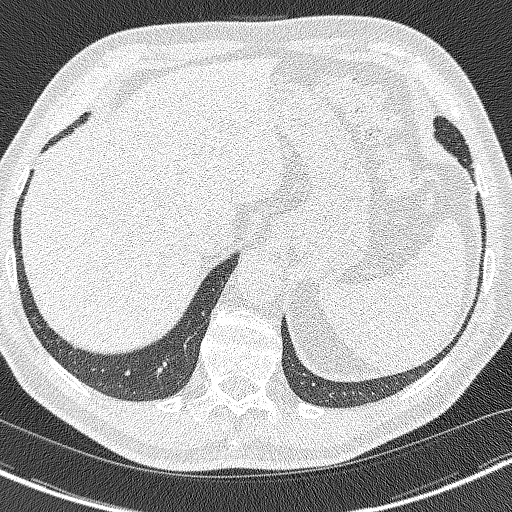
[im 71/295  lung]
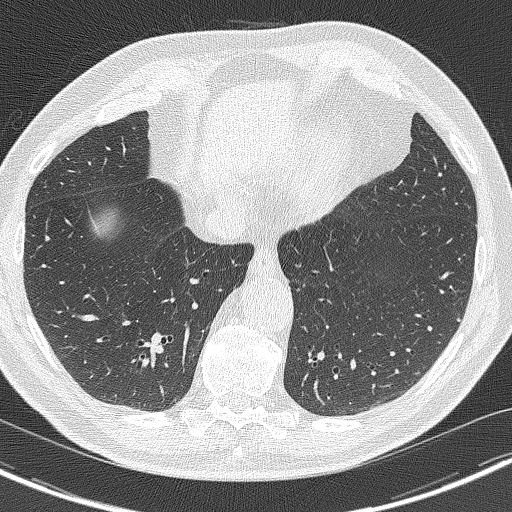
[im 85/295  lung]
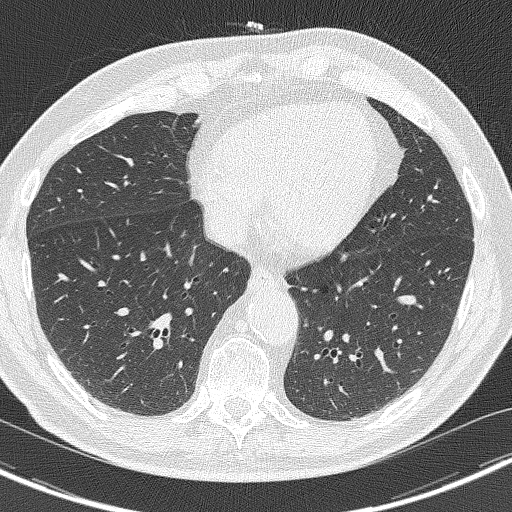
[im 113/295  mediastinal]
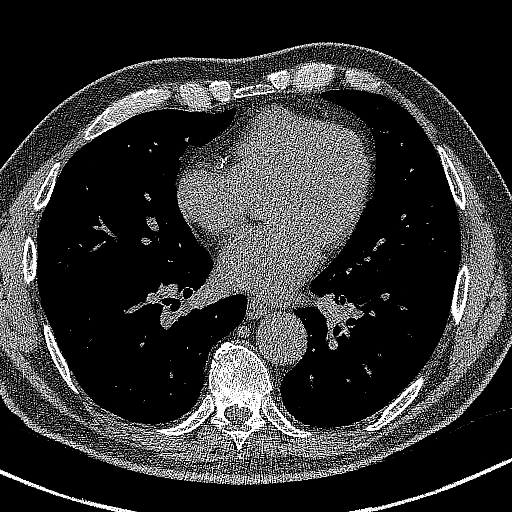
[im 113/295  lung]
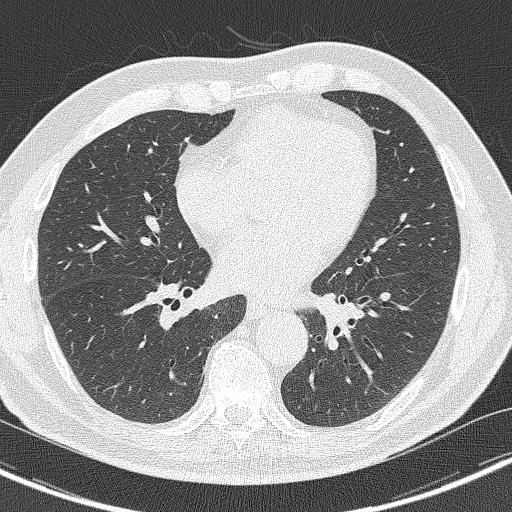
[im 141/295  lung]
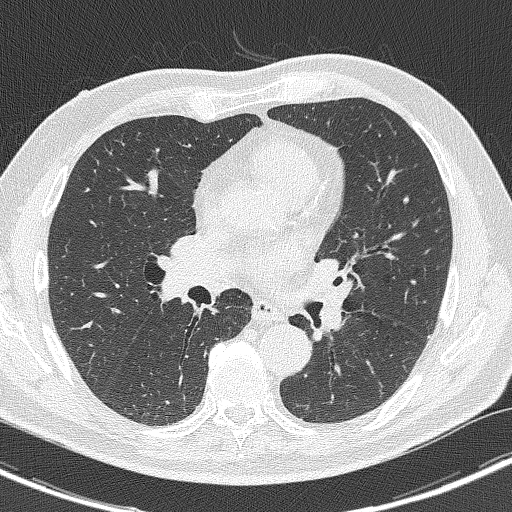
[im 155/295  lung]
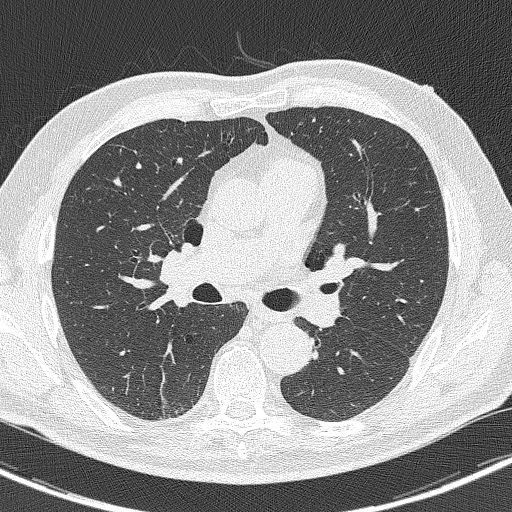
[im 183/295  lung]
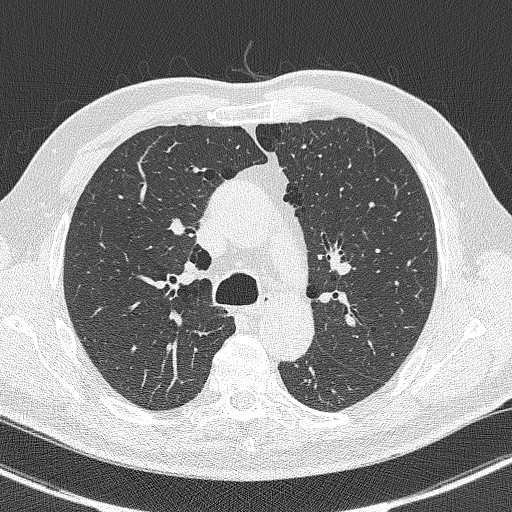
[im 211/295  mediastinal]
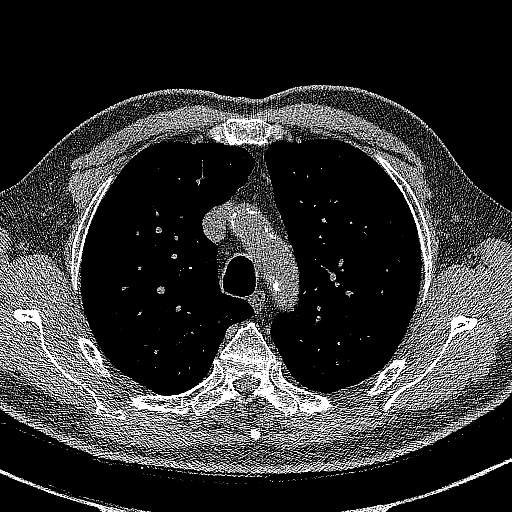
[im 211/295  lung]
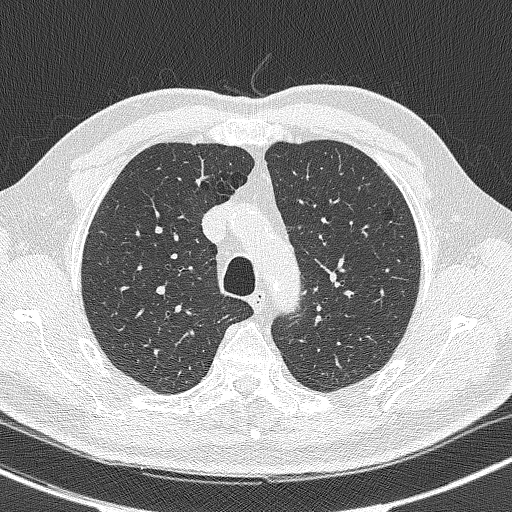
[im 225/295  lung]
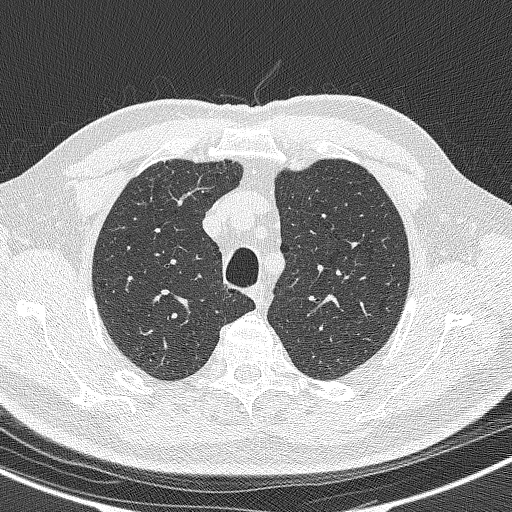
[im 253/295  lung]
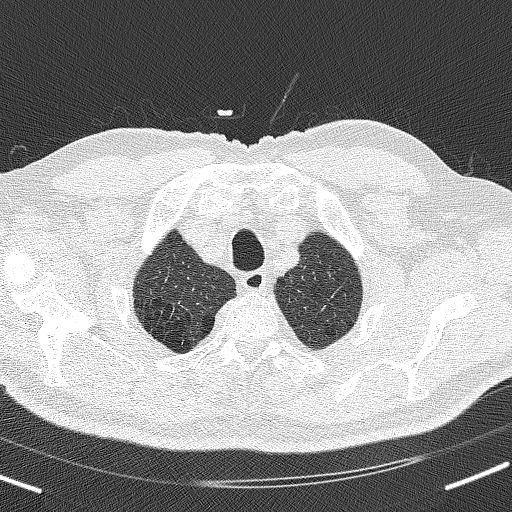
[im 281/295  lung]
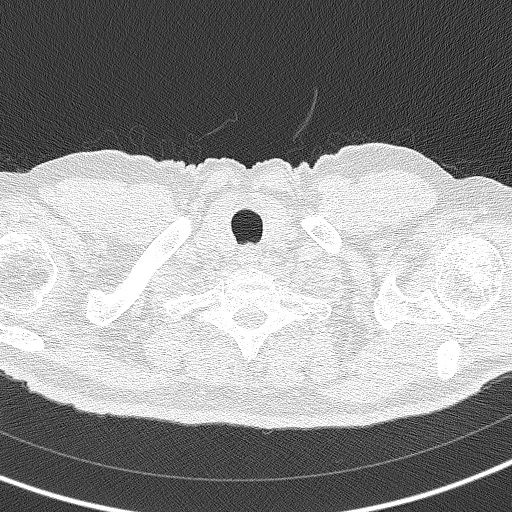

[Series 5: lung 1.00 br44 cor · coronal · 0.57mm/px · 3 of 287 slices shown]
[im 58/287  lung]
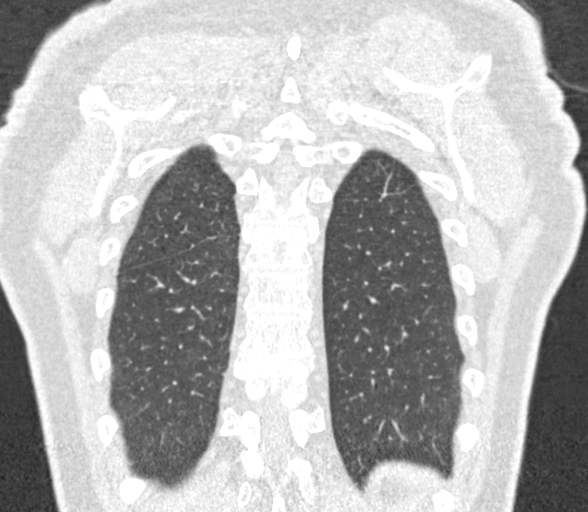
[im 115/287  lung]
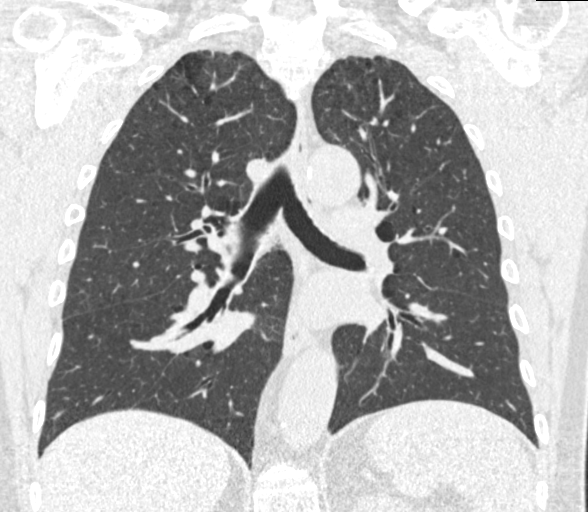
[im 172/287  lung]
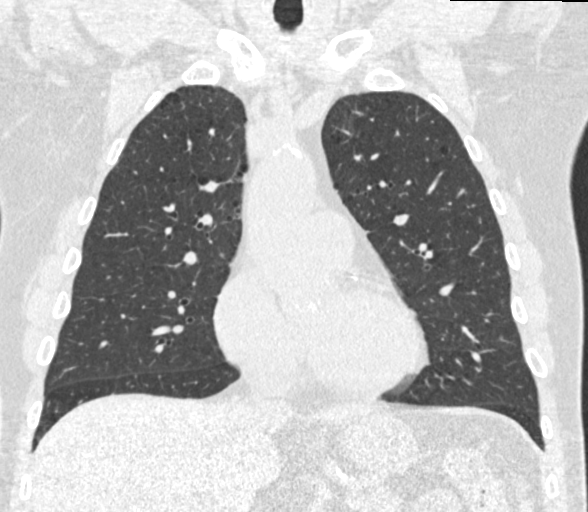

[15 of 40 positions shown; findings below may reference images not displayed]

FINDINGS: Cardiovascular: Heart size is normal. There is no significant
pericardial fluid, thickening or pericardial calcification. There is
aortic atherosclerosis, as well as atherosclerosis of the great
vessels of the mediastinum and the coronary arteries, including
calcified atherosclerotic plaque in the left main, left anterior
descending, left circumflex and right coronary arteries.
Calcifications of the aortic valve.

Mediastinum/Nodes: No pathologically enlarged mediastinal or hilar
lymph nodes. Please note that accurate exclusion of hilar adenopathy
is limited on noncontrast CT scans. Esophagus is unremarkable in
appearance. No axillary lymphadenopathy.

Lungs/Pleura: Previously noted right lower lobe airspace
consolidation has resolved. On today's examination there are no
suspicious appearing pulmonary nodules or masses. Small right upper
lobe calcified granuloma incidentally noted. No acute consolidative
airspace disease. No pleural effusions. Diffuse bronchial wall
thickening with mild centrilobular and moderate paraseptal
emphysema.

Upper Abdomen: Aortic atherosclerosis.

Musculoskeletal: There are no aggressive appearing lytic or blastic
lesions noted in the visualized portions of the skeleton.
IMPRESSION: 1. Lung-RADS 1S, negative. Continue annual screening with low-dose
chest CT without contrast in 12 months.
2. The "S" modifier above refers to potentially clinically
significant non lung cancer related findings. Specifically, there is
aortic atherosclerosis, in addition to left main and 3 vessel
coronary artery disease. Assessment for potential risk factor
modification, dietary therapy or pharmacologic therapy may be
warranted, if clinically indicated.
3. Mild diffuse bronchial wall thickening with mild centrilobular
and paraseptal emphysema; imaging findings suggestive of underlying
COPD.

Aortic Atherosclerosis (H2B9Q-ZE2.2) and Emphysema ([id number]-[id number].V).

## 2019-10-12 ENCOUNTER — Other Ambulatory Visit: Payer: Self-pay

## 2019-10-12 ENCOUNTER — Telehealth: Payer: Self-pay

## 2019-10-12 DIAGNOSIS — Z1211 Encounter for screening for malignant neoplasm of colon: Secondary | ICD-10-CM

## 2019-10-12 NOTE — Telephone Encounter (Signed)
I've never heard of that before. I guess her should come by and get another one-->any way we can avoid charging him again?

## 2019-10-12 NOTE — Telephone Encounter (Signed)
Received report from pt's fecal immunochemical test (FIT) and sample could not be processed.

## 2019-10-12 NOTE — Telephone Encounter (Signed)
Spoke with patient and able to confirm which stool test performed. Patient will come by tomorrow and pick up kit to do another test. Will enter order 

## 2019-11-01 ENCOUNTER — Other Ambulatory Visit (INDEPENDENT_AMBULATORY_CARE_PROVIDER_SITE_OTHER): Payer: Medicare Other

## 2019-11-01 DIAGNOSIS — Z1211 Encounter for screening for malignant neoplasm of colon: Secondary | ICD-10-CM | POA: Diagnosis not present

## 2019-11-01 LAB — FECAL OCCULT BLOOD, IMMUNOCHEMICAL: Fecal Occult Bld: NEGATIVE

## 2019-12-25 ENCOUNTER — Ambulatory Visit: Payer: Medicare Other | Attending: Internal Medicine

## 2019-12-25 DIAGNOSIS — Z23 Encounter for immunization: Secondary | ICD-10-CM | POA: Insufficient documentation

## 2019-12-25 NOTE — Progress Notes (Signed)
   Covid-19 Vaccination Clinic  Name:  Patrick Price    MRN: [id number] DOB: [birth year]  12/25/2019  [name] was observed post Covid-19 immunization for 15 minutes without incidence. He was provided with Vaccine Information Sheet and instruction to access the V-Safe system.   [name] was instructed to call 911 with any severe reactions post vaccine: [name] Difficulty breathing  . Swelling of your face and throat  . A fast heartbeat  . A bad rash all over your body  . Dizziness and weakness    Immunizations Administered    Name Date Dose VIS Date Route   Pfizer COVID-19 Vaccine 12/25/2019 10:51 AM 0.3 mL 10/14/2019 Intramuscular   Manufacturer: [employer], [employer]   Lot: [id number]   NDC: [address]-5

## 2020-01-17 ENCOUNTER — Ambulatory Visit: Payer: Medicare Other | Attending: Internal Medicine

## 2020-01-17 DIAGNOSIS — Z23 Encounter for immunization: Secondary | ICD-10-CM

## 2020-01-17 NOTE — Progress Notes (Signed)
   Covid-19 Vaccination Clinic  Name:  Patrick Price    MRN: [id number] DOB: [birth year]  01/17/2020  Mr. Patrick Price was observed post Covid-19 immunization for 15 minutes without incident. He was provided with Vaccine Information Sheet and instruction to access the V-Safe system.   Mr. Patrick Price was instructed to call 911 with any severe reactions post vaccine: [name] Difficulty breathing  . Swelling of face and throat  . A fast heartbeat  . A bad rash all over body  . Dizziness and weakness   Immunizations Administered    Name Date Dose VIS Date Route   Pfizer COVID-19 Vaccine 01/17/2020 10:40 AM 0.3 mL 10/14/2019 Intramuscular   Manufacturer: [employer], [employer]   Lot: [id number]   NDC: [address]-0

## 2020-02-27 ENCOUNTER — Telehealth: Payer: Self-pay | Admitting: Cardiovascular Disease

## 2020-02-27 NOTE — Telephone Encounter (Signed)
Duration of antiplatelet therapy will need to be addressed by MD.  Pt last saw Dr Tresa Endo in October 2018 and since then has just had 1 virtual visit with PA in April 2020 (was supposed to have 3 month follow up). He is overdue for appt with MD who may wish to discuss this with pt in person depending on any potential cardiac symptoms.

## 2020-02-27 NOTE — Telephone Encounter (Signed)
Called and spoke with pt regarding Pharmacists advice. Able to get pt an OV with Azalee Course on 02/29/20 at 8:45am to follow up. Pt verbalized understanding with no other questions at this time. Will make MD and PA aware

## 2020-02-27 NOTE — Telephone Encounter (Signed)
New Message  Pt is calling and wants to know if he should still be taking Brilinta due to him having a stent put in a little over a year or so ago

## 2020-02-27 NOTE — Telephone Encounter (Signed)
Pt calling in asking if he needs to continue to be on his Brillinta if his stent was placed over a year ago. Notified I would send this message to our pharmacists for review. Also notified he needs and appt and that as soon as our schedule opens up for July we would get him scheduled. Pt verbalized understanding with no other questions at this time.

## 2020-02-29 ENCOUNTER — Other Ambulatory Visit: Payer: Self-pay

## 2020-02-29 ENCOUNTER — Ambulatory Visit: Payer: Medicare Other | Admitting: Physician Assistant

## 2020-02-29 ENCOUNTER — Encounter: Payer: Self-pay | Admitting: Physician Assistant

## 2020-02-29 VITALS — BP 120/70 | HR 58 | Temp 97.5°F | Ht 69.0 in | Wt 169.0 lb

## 2020-02-29 DIAGNOSIS — I2510 Atherosclerotic heart disease of native coronary artery without angina pectoris: Secondary | ICD-10-CM | POA: Diagnosis not present

## 2020-02-29 DIAGNOSIS — E785 Hyperlipidemia, unspecified: Secondary | ICD-10-CM | POA: Diagnosis not present

## 2020-02-29 DIAGNOSIS — Z9861 Coronary angioplasty status: Secondary | ICD-10-CM | POA: Diagnosis not present

## 2020-02-29 MED ORDER — NITROGLYCERIN 0.4 MG SL SUBL: 0.4000 mg | SUBLINGUAL_TABLET | SUBLINGUAL | 3 refills | Status: AC | PRN

## 2020-02-29 NOTE — Patient Instructions (Signed)
Medication Instructions:  STOP- Brilinta  *If you need a refill on your cardiac medications before your next appointment, please call your pharmacy*   Lab Work: None Ordered   Testing/Procedures: None Ordered   Follow-Up: At [facility], you and your health needs are our priority.  As part of our continuing mission to provide you with exceptional heart care, we have created designated Provider Care Teams.  These Care Teams include your primary Cardiologist (physician) and Advanced Practice Providers (APPs -  Physician Assistants and Nurse Practitioners) who all work together to provide you with the care you need, when you need it.  We recommend signing up for the patient portal called "MyChart".  Sign up information is provided on this After Visit Summary.  MyChart is used to connect with patients for Virtual Visits (Telemedicine).  Patients are able to view lab/test results, encounter notes, upcoming appointments, etc.  Non-urgent messages can be sent to your provider as well.   To learn more about what you can do with MyChart, go to [email or web address].    Your next appointment:   6 month(s)  The format for your next appointment:   In Person  Provider:   You may see Nicki Guadalajara, MD or one of the following Advanced Practice Providers on your designated Care Team:    Azalee Course, PA-C  Micah Flesher, PA-C or   Judy Pimple, [place]

## 2020-02-29 NOTE — Progress Notes (Signed)
Cardiology Office Note:    Date:  03/02/2020   ID:  [name], DOB 1946/04/24, MRN [id number]  PCP:  Jeoffrey Massed, MD  Cardiologist:  Nicki Guadalajara, MD  Electrophysiologist:  None   Referring MD: Jeoffrey Massed, MD   Chief Complaint  Patient presents with  . Follow-up    seen for Dr. Tresa Endo.     History of Present Illness:    Patrick Price is a 74 y.o. male with a hx of HLD, former smoker and CAD s/p stent to RCA in 1999.  He had a negative Myoview in 2014.  Low-dose CT scan obtained in January 2020 was negative for lung cancer but does show underlying changes suggestive of COPD.  Patient was initially admitted as NSTEMI however in the afternoon of admission, patient had recurrent chest pain and EKG changes suggestive of evolving STEMI.  He was taken urgently to the Cath Lab which revealed a 99% proximal RCA lesion treated with DES, 75% distal RCA lesion also treated with resolute Onyx 2.5 x 15 mm DES.  EF was 50 to 55% by LV gram.  Prior to discharge, losartan was added to his medical regimen, however patient refused.  He presents today for cardiology office visit.  He walk about 3 miles per day and denies any recent exertional chest pain or shortness of breath.  Since he has completed 12 months of Brilinta, he may stop this medication.  Otherwise his last lipid obtained in October 2020 showed very well-controlled cholesterol.  He will continue aspirin and Lipitor.  In the past year, he has not used any nitroglycerin.  He can follow-up with Dr. Tresa Endo in 6 to 8 months.  Past Medical History:  Diagnosis Date  . Ataxia 03/09/2015  . Bell's palsy    left  . CAD S/P BMS PCI-RCA 1999; 01/2019   a) 1999 - BMS PCI RCA (in [place]. Followed by Dr. Tresa Endo); [id number]: nuclear stress test- low risk scan. no ischemia or infarct/scar is seen EF 77%. c) Mar 2020->2 Resolute Onyx DES stents to RCA, EF 55-60%, inf hypokin, elev LVEDP  . CAP (community acquired pneumonia) 07/2018   Noted on  noncontrast, low dose CT done for lung ca screening.  . Chronic renal insufficiency, stage 3 (moderate) 2019   GFR 50s  . Colon cancer screening 2019   FIT test neg 03/21/18  . Hyperlipidemia    Recommended pt restart atorva 40 mg qd 06/28/18 but he declined, stating he had intol to rosuva and atorva (?cough? per pt).  . Hypertension    pt adamantly denies this dx  . Loss of touch sensation on examination 03/09/2015  . Polyneuropathy 03/2015   NCS/EMG 2016: symmetric length-dependent moderately severe axonal sensorimotor polyneuropathy (no ulnar or median neuropathy, no radiculopathy).  . STEMI (ST elevation myocardial infarction) (HCC) 01/2019   2 Resolute Onyx DES stents to RCA: ASA + plavix, statin.  (HR too low for BB, plus pt refused ARB).  . Tobacco abuse    Quit 03/2018.  (30+ pack-yr cigs).    Past Surgical History:  Procedure Laterality Date  . CARDIOVASCULAR STRESS TEST  2016   Myocard perf imaging: normal (EF/LV fxn normal, no ischemia)  . COLONOSCOPY  06/2006; 2014   2014 NORMAL.  Recall 10 yrs Deboraha Sprang).  FIT test NEG 03/21/18.  . CORONARY ANGIOPLASTY WITH STENT PLACEMENT  1999   RCA stent 1999 Claris Gower)  . CORONARY/GRAFT ACUTE MI REVASCULARIZATION N/A 01/23/2019   2 Resolute Onyx DES  stents to RCA. Procedure: Coronary/Graft Acute MI Revascularization;  Surgeon: Marykay Lex, MD;  Location: [facility] INVASIVE CV LAB;  Service: Cardiovascular;  Laterality: N/A;  . LEFT HEART CATH AND CORONARY ANGIOGRAPHY N/A 01/23/2019   2 Resolute Onyx DES stents to RCA. Procedure: LEFT HEART CATH AND CORONARY ANGIOGRAPHY;  Surgeon: Marykay Lex, MD;  Location: [facility] INVASIVE CV LAB;  Service: Cardiovascular;  Laterality: N/A;    Current Medications: Current Meds  Medication Sig  . aspirin EC 81 MG EC tablet Take 1 tablet (81 mg total) by mouth daily.  [name] atorvastatin (LIPITOR) 80 MG tablet Take 1 tablet (80 mg total) by mouth daily at 6 PM.  . nitroGLYCERIN (NITROSTAT) 0.4 MG SL tablet Place 1  tablet (0.4 mg total) under the tongue every 5 (five) minutes x 3 doses as needed for chest pain.  . [DISCONTINUED] nitroGLYCERIN (NITROSTAT) 0.4 MG SL tablet Place 1 tablet (0.4 mg total) under the tongue every 5 (five) minutes x 3 doses as needed for chest pain.  . [DISCONTINUED] ticagrelor (BRILINTA) 90 MG TABS tablet Take 1 tablet (90 mg total) by mouth 2 (two) times daily.     Allergies:   Patient has no known allergies.   Social History   Socioeconomic History  . Marital status: Married    Spouse name: Not on file  . Number of children: 2  . Years of education: 49  . Highest education level: Not on file  Occupational History  . Occupation: Research officer, trade union- unemployed currently   Tobacco Use  . Smoking status: Former Smoker    Packs/day: 0.10    Years: 50.00    Pack years: 5.00    Types: Cigarettes    Quit date: 02/15/2007    Years since quitting: 13.0  . Smokeless tobacco: Never Used  Substance and Sexual Activity  . Alcohol use: Yes    Alcohol/week: 0.0 standard drinks    Comment: rare, 3-4 x a yr  . Drug use: No  . Sexual activity: Not on file  Other Topics Concern  . Not on file  Social History Narrative   Married, one son and one daughter.   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   50 pack-yr hx.  Quit x 10 yrs, then restarted 2018.   No alcohol.         2-3 cups of caffeine daily   Social Determinants of Health   Financial Resource Strain:   . Difficulty of Paying Living Expenses:   Food Insecurity:   . Worried About Programme researcher, broadcasting/film/video in the Last Year:   . Barista in the Last Year:   Transportation Needs:   . Freight forwarder (Medical):   [name] Lack of Transportation (Non-Medical):   Physical Activity:   . Days of Exercise per Week:   . Minutes of Exercise per Session:   Stress:   . Feeling of Stress :   Social Connections:   . Frequency of Communication with Friends and Family:     . Frequency of Social Gatherings with Friends and Family:   . Attends Religious Services:   . Active Member of Clubs or Organizations:   . Attends Banker Meetings:   [name] Marital Status:      Family History: The patient's family history includes Heart attack in his mother. There is no history of Cancer or Diabetes.  ROS:  Please see the history of present illness.     All other systems reviewed and are negative.  EKGs/Labs/Other Studies Reviewed:    The following studies were reviewed today:  Cath 01/23/2019  Culprit Lesion #1: Prox RCA-1 lesion is 99% stenosed just proximal to prior stent. Prox RCA-2 previously placed bare-metal stent (BMS) is 30% stenosed.  A drug-eluting stent was successfully placed covering the lesion and the prior BMS stent using a STENT RESOLUTE ONYX 2.5X18. Postdilated 2.85 mm  Post intervention, there is a 0% residual stenosis.  Lesion #2: Dist RCA lesion is 75% stenosed.  A drug-eluting stent was successfully placed using a STENT RESOLUTE ONYX 2.5X15. Postdilated to 2.8 mm  Post intervention, there is a 0% residual stenosis.  Prox RCA to Mid RCA lesion is 30% stenosed. Distal to the new stent  -------------------------------------  Prox LAD lesion is 40% stenosed. Between 1st & 2nd Diag branches  The Left Ventricular Systolic Function is normal, with an estimated Left Ventricular EF is 50-55% by visual estimate. LV end diastolic pressure is mildly elevated.  A drug-eluting stent was successfully placed using a STENT RESOLUTE ONYX 2.5X15.  A drug-eluting stent was successfully placed using a STENT RESOLUTE ONYX 2.5X18.   SUMMARY  Severe single-vessel disease with 2 lesions in the proximal and distal RCA treated with 2 Resolute Onyx DES stents (2.5 mm x 15 mm distal, 2.5 mm x 18 mm proximal covering prior stent -post postdilated to 2.8 mm)  Minimal disease elsewhere.  Preserved LVEF of 50 to 55% with inferior hypokinesis and  moderately elevated LVEDP.  RECOMMENDATIONS  The patient be transferred to the CCU for overnight monitoring, but potentially be discharged as early as tomorrow.  Continue aggressive risk factor modification  Dual antiplatelet therapy per recommendations - minimum 1 yr.  EKG:  EKG is ordered today.  The ekg ordered today demonstrates sinus bradycardia, no significant ST-T wave changes  Recent Labs: 08/04/2019: ALT 24; BUN 19; Creatinine, Ser 1.11; Hemoglobin 14.7; Platelets 159.0; Potassium 4.2; Sodium 141  Recent Lipid Panel    Component Value Date/Time   CHOL 135 08/04/2019 0851   TRIG 145.0 08/04/2019 0851   HDL 35.30 (L) 08/04/2019 0851   CHOLHDL 4 08/04/2019 0851   VLDL 29.0 08/04/2019 0851   LDLCALC 71 08/04/2019 0851   LDLDIRECT 127.0 06/28/2018 0846    Physical Exam:    VS:  BP 120/70   Pulse (!) 58   Temp (!) 97.5 F (36.4 C)   Ht 5\' 9"  (1.753 m)   Wt 169 lb (76.7 kg)   SpO2 99%   BMI 24.96 kg/m     Wt Readings from Last 3 Encounters:  02/29/20 169 lb (76.7 kg)  07/29/19 165 lb 8 oz (75.1 kg)  02/11/19 168 lb (76.2 kg)     GEN:  Well nourished, well developed in no acute distress HEENT: Normal NECK: No JVD; No carotid bruits LYMPHATICS: No lymphadenopathy CARDIAC: RRR, no murmurs, rubs, gallops RESPIRATORY:  Clear to auscultation without rales, wheezing or rhonchi  ABDOMEN: Soft, non-tender, non-distended MUSCULOSKELETAL:  No edema; No deformity  SKIN: Warm and dry NEUROLOGIC:  Alert and oriented x 3 PSYCHIATRIC:  Normal affect   ASSESSMENT:    1. CAD S/P percutaneous coronary angioplasty   2. Hyperlipidemia with target LDL less than 70    PLAN:    In order of problems listed above:  1. CAD: He has completed 1 year of dual antiplatelet therapy including aspirin and Brilinta.  He may  stop Brilinta at this point.  He denies any recent chest discomfort.  Continue aspirin and Lipitor  2. Hyperlipidemia: On Lipitor 80 mg daily.  Last lipid panel  obtained the August 04, 2019 showed very well-controlled cholesterol.   Medication Adjustments/Labs and Tests Ordered: Current medicines are reviewed at length with the patient today.  Concerns regarding medicines are outlined above.  Orders Placed This Encounter  Procedures  . EKG 12-Lead   Meds ordered this encounter  Medications  . nitroGLYCERIN (NITROSTAT) 0.4 MG SL tablet    Sig: Place 1 tablet (0.4 mg total) under the tongue every 5 (five) minutes x 3 doses as needed for chest pain.    Dispense:  25 tablet    Refill:  3    Patient Instructions  Medication Instructions:  STOP- Brilinta  *If you need a refill on your cardiac medications before your next appointment, please call your pharmacy*   Lab Work: None Ordered   Testing/Procedures: None Ordered   Follow-Up: At [facility], you and your health needs are our priority.  As part of our continuing mission to provide you with exceptional heart care, we have created designated Provider Care Teams.  These Care Teams include your primary Cardiologist (physician) and Advanced Practice Providers (APPs -  Physician Assistants and Nurse Practitioners) who all work together to provide you with the care you need, when you need it.  We recommend signing up for the patient portal called "MyChart".  Sign up information is provided on this After Visit Summary.  MyChart is used to connect with patients for Virtual Visits (Telemedicine).  Patients are able to view lab/test results, encounter notes, upcoming appointments, etc.  Non-urgent messages can be sent to your provider as well.   To learn more about what you can do with MyChart, go to [email or web address].    Your next appointment:   6 month(s)  The format for your next appointment:   In Person  Provider:   You may see Shelva Majestic, MD or one of the following Advanced Practice Providers on your designated Care Team:    Almyra Deforest, PA-C  Fabian Sharp, PA-C or   Roby Lofts, PA-C         Signed, [place], [place]  03/02/2020 10:58 PM    [place]

## 2020-03-02 ENCOUNTER — Encounter: Payer: Self-pay | Admitting: Physician Assistant

## 2020-03-04 NOTE — Telephone Encounter (Signed)
ok 

## 2020-03-23 ENCOUNTER — Encounter: Payer: Self-pay | Admitting: Cardiovascular Disease

## 2020-03-23 ENCOUNTER — Other Ambulatory Visit: Payer: Self-pay

## 2020-03-23 ENCOUNTER — Ambulatory Visit: Payer: Medicare Other | Admitting: Cardiovascular Disease

## 2020-03-23 VITALS — BP 116/68 | HR 60 | Ht 69.0 in | Wt 169.8 lb

## 2020-03-23 DIAGNOSIS — E785 Hyperlipidemia, unspecified: Secondary | ICD-10-CM | POA: Diagnosis not present

## 2020-03-23 DIAGNOSIS — I214 Non-ST elevation (NSTEMI) myocardial infarction: Secondary | ICD-10-CM

## 2020-03-23 DIAGNOSIS — I2510 Atherosclerotic heart disease of native coronary artery without angina pectoris: Secondary | ICD-10-CM | POA: Diagnosis not present

## 2020-03-23 DIAGNOSIS — Z9861 Coronary angioplasty status: Secondary | ICD-10-CM

## 2020-03-23 MED ORDER — CLOPIDOGREL BISULFATE 75 MG PO TABS: 75.0000 mg | ORAL_TABLET | Freq: Every day | ORAL | 3 refills | Status: DC

## 2020-03-23 NOTE — Patient Instructions (Signed)
Medication Instructions:  BEGIN TAKING CLOPIDOGREL (PLAVIX) 75MG  DAILY  *If you need a refill on your cardiac medications before your next appointment, please call your pharmacy*   Lab Work: FASTING LABS IN ONE WEEK: AT THE HOSPITAL CM[facility] CBC TSH LIPID [id number] If you have labs (blood work) drawn today and your tests are completely normal, you will receive your results only by: MyChart Message (if you have MyChart) OR . A paper copy in the mail If you have any lab test that is abnormal or we need to change your treatment, we will call you to review the results.  Follow-Up: At [facility], you and your health needs are our priority.  As part of our continuing mission to provide you with exceptional heart care, we have created designated Provider Care Teams.  These Care Teams include your primary Cardiologist (physician) and Advanced Practice Providers (APPs -  Physician Assistants and Nurse Practitioners) who all work together to provide you with the care you need, when you need it.  We recommend signing up for the patient portal called "MyChart".  Sign up information is provided on this After Visit Summary.  MyChart is used to connect with patients for Virtual Visits (Telemedicine).  Patients are able to view lab/test results, encounter notes, upcoming appointments, etc.  Non-urgent messages can be sent to your provider as well.   To learn more about what you can do with MyChart, go to [facility].    Your next appointment:   6 month(s)  The format for your next appointment:   In Person  Provider:   [email or web address], MD

## 2020-03-23 NOTE — Progress Notes (Signed)
Patient ID: [name], male   DOB: October 08, 1946, 74 y.o.   MRN: [id number]     HPI: Dolphus Linch is a 74 y.o. male who presents for a 65 month cardiology evaluation.  Mr. Beber has established CAD underwent PTCA and stenting of his RCA in 1999. He has a long-standing history of prior tobacco use but quit smoking  when he turns 60. He does have a history of hyperlipidemia. A two-year followup nuclear perfusion study in May 2014  continued to demonstrate normal perfusion without scar or ischemia on his medical therapy.  He has a history of hyperlipidemia and had been on atorvastatin 40 mg daily in addition to fish oil.  He has been on Toprol-XL 25 mg daily.  In March 2016 he developed Bell's palsy.  Ultimately this improved.  However, 2 weeks later he developed some numbness of his hands and feet, lips and tongue.  He was evaluated by neurology and was started on gabapentin for his peripheral neuropathy.  When I last saw me was on a tapering steroid dose.  When I saw him last in October 2018 after not having seen him for 2 years previously he continued to feel well from a cardiac standpoint.   He had lost over 30 pounds.  He was exercises regularly.  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  He had stopped all his medications for the past year and a half.  He states he has felt well off the medications.  He no longer has peripheral neuropathy symptoms.  He denies any episodes of chest tightness.  He denies shortness of breath.  He was walking his dog at least a mile several days per week.   Since I last saw him, in March 2020 he developed recurrent chest pain and after initially being admitted as a non-STEMI his ECG suggested changes of an evolving STEMI.  He was taken urgently to the Cath Lab by Dr. Ellyn Hack and was found to have 99% proximal RCA stenosis treated with DES stenting.  There was a 75% distal RCA lesion also treated with DES stenting.  EF was 50 to 55% by  left-ventricular atrophy.  At discharge losartan was added to his medicine regimen but the patient refused.  He apparently had done well and was recently seen by Almyra Deforest, [place] on February 29, 2020.  During his evaluation he was feeling well and was walking at least 3 miles per day.  At that time he was taken off Brilinta and continues to be on Lipitor 80 mg daily for hyperlipidemia.  Presently, he is still working in [employer].  He denies active chest pain.  He continues to walk 3 to 4 miles per day.  He tells me he had a Covid infection in August 2020 where he lost his sense of taste and had significant fatigability.  He presents for evaluation.  Past Medical History:  Diagnosis Date  . Ataxia 03/09/2015  . Bell's palsy    left  . CAD S/P BMS PCI-RCA 1999; 01/2019   a) 1999 - BMS PCI RCA (in [place]. Followed by Dr. Claiborne Billings); [id number]: nuclear stress test- low risk scan. no ischemia or infarct/scar is seen EF 77%. c) Mar 2020->2 Resolute Onyx DES stents to RCA, EF 55-60%, inf hypokin, elev LVEDP  . CAP (community acquired pneumonia) 07/2018   Noted on noncontrast, low dose CT done for lung ca screening.  . Chronic renal insufficiency, stage 3 (  moderate) 2019   GFR 50s  . Colon cancer screening 2019   FIT test neg 03/21/18  . Hyperlipidemia    Recommended pt restart atorva 40 mg qd 06/28/18 but he declined, stating he had intol to rosuva and atorva (?cough? per pt).  . Hypertension    pt adamantly denies this dx  . Loss of touch sensation on examination 03/09/2015  . Polyneuropathy 03/2015   NCS/EMG 2016: symmetric length-dependent moderately severe axonal sensorimotor polyneuropathy (no ulnar or median neuropathy, no radiculopathy).  . STEMI (ST elevation myocardial infarction) ([place]) 01/2019   2 Resolute Onyx DES stents to RCA: ASA + plavix, statin.  (HR too low for BB, plus pt refused ARB).  . Tobacco abuse    Quit 03/2018.  (30+ pack-yr cigs).    Past Surgical History:  Procedure  Laterality Date  . CARDIOVASCULAR STRESS TEST  2016   Myocard perf imaging: normal (EF/LV fxn normal, no ischemia)  . COLONOSCOPY  06/2006; 2014   2014 NORMAL.  Recall 10 yrs Sadie Haber).  FIT test NEG 03/21/18.  . CORONARY ANGIOPLASTY WITH STENT PLACEMENT  1999   RCA stent 1999 Baldo Ash)  . CORONARY/GRAFT ACUTE MI REVASCULARIZATION N/A 01/23/2019   2 Resolute Onyx DES stents to RCA. Procedure: Coronary/Graft Acute MI Revascularization;  Surgeon: Leonie Man, MD;  Location: [facility];  Service: Cardiovascular;  Laterality: N/A;  . LEFT HEART CATH AND CORONARY ANGIOGRAPHY N/A 01/23/2019   2 Resolute Onyx DES stents to RCA. Procedure: LEFT HEART CATH AND CORONARY ANGIOGRAPHY;  Surgeon: Leonie Man, MD;  Location: [facility];  Service: Cardiovascular;  Laterality: N/A;    No Known Allergies  Current Outpatient Medications  Medication Sig Dispense Refill  . aspirin EC 81 MG EC tablet Take 1 tablet (81 mg total) by mouth daily. 90 tablet 3  . atorvastatin (LIPITOR) 80 MG tablet Take 1 tablet (80 mg total) by mouth daily at 6 PM. 90 tablet 3  . nitroGLYCERIN (NITROSTAT) 0.4 MG SL tablet Place 1 tablet (0.4 mg total) under the tongue every 5 (five) minutes x 3 doses as needed for chest pain. 25 tablet 3  . clopidogrel (PLAVIX) 75 MG tablet Take 1 tablet (75 mg total) by mouth daily. 90 tablet 3   No current facility-administered medications for this visit.    Social History   Socioeconomic History  . Marital status: Married    Spouse name: Not on file  . Number of children: 2  . Years of education: 5  . Highest education level: Not on file  Occupational History  . Occupation: Media planner- unemployed currently   Tobacco Use  . Smoking status: Former Smoker    Packs/day: 0.10    Years: 50.00    Pack years: 5.00    Types: Cigarettes    Quit date: 02/15/2007    Years since quitting: 13.1  . Smokeless tobacco: Never Used  Substance and Sexual Activity  .  Alcohol use: Yes    Alcohol/week: 0.0 standard drinks    Comment: rare, 3-4 x a yr  . Drug use: No  . Sexual activity: Not on file  Other Topics Concern  . Not on file  Social History Narrative   Married, one son and one daughter.   Educ: HS   OccupCatering manager   No current tob but is vaping to try to quit cigs.   50 pack-yr hx.  Quit x 10 yrs, then restarted 2018.   No alcohol.  2-3 cups of caffeine daily   Social Determinants of Health   Financial Resource Strain:   . Difficulty of Paying Living Expenses:   Food Insecurity:   . Worried About Charity fundraiser in the Last Year:   . Arboriculturist in the Last Year:   Transportation Needs:   . Film/video editor (Medical):   [name] Lack of Transportation (Non-Medical):   Physical Activity:   . Days of Exercise per Week:   . Minutes of Exercise per Session:   Stress:   . Feeling of Stress :   Social Connections:   . Frequency of Communication with Friends and Family:   . Frequency of Social Gatherings with Friends and Family:   . Attends Religious Services:   . Active Member of Clubs or Organizations:   . Attends Archivist Meetings:   [name] Marital Status:   Intimate Partner Violence:   . Fear of Current or Ex-Partner:   . Emotionally Abused:   [name] Physically Abused:   . Sexually Abused:    Socially he is married and has 2 children.  Family History  Problem Relation Age of Onset  . Heart attack Mother   . Cancer Neg Hx   . Diabetes Neg Hx    ROS General: Negative; No fevers, chills, or night sweats;  HEENT: Negative; No changes in vision or hearing, sinus congestion, difficulty swallowing Pulmonary: Negative; No cough, wheezing, shortness of breath, hemoptysis Cardiovascular: See HPI GI: Negative; No nausea, vomiting, diarrhea, or abdominal pain GU: Negative; No dysuria, hematuria, or difficulty voiding Musculoskeletal: Negative; no myalgias, joint pain, or  weakness Hematologic/Oncology: Negative; no easy bruising, bleeding Endocrine: Negative; no heat/cold intolerance; no diabetes Neuro: Recent Bell's palsy.  Recent development of peripheral neuropathy. Skin: Negative; No rashes or skin lesions Psychiatric: Negative; No behavioral problems, depression Sleep: Negative; No snoring, daytime sleepiness, hypersomnolence, bruxism, restless legs, hypnogognic hallucinations, no cataplexy Other comprehensive 14 point system review is negative.  PE BP 116/68 (BP Location: Right Arm, Patient Position: Sitting, Cuff Size: Normal)   Pulse 60   Ht 5' 9"  (1.753 m)   Wt 169 lb 12.8 oz (77 kg)   SpO2 98%   BMI 25.08 kg/m    Repeat blood pressure by me was 120/70  Wt Readings from Last 3 Encounters:  03/23/20 169 lb 12.8 oz (77 kg)  02/29/20 169 lb (76.7 kg)  07/29/19 165 lb 8 oz (75.1 kg)   General: Alert, oriented, no distress.  Skin: normal turgor, no rashes, warm and dry HEENT: Normocephalic, atraumatic. Pupils equal round and reactive to light; sclera anicteric; extraocular muscles intact; Nose without nasal septal hypertrophy Mouth/Parynx benign; Mallinpatti scale 2 Neck: No JVD, no carotid bruits; normal carotid upstroke Lungs: clear to ausculatation and percussion; no wheezing or rales Chest wall: without tenderness to palpitation Heart: PMI not displaced, RRR, s1 s2 normal, 1/6 systolic murmur, no diastolic murmur, no rubs, gallops, thrills, or heaves Abdomen: soft, nontender; no hepatosplenomehaly, BS+; abdominal aorta nontender and not dilated by palpation. Back: no CVA tenderness Pulses 2+ Musculoskeletal: full range of motion, normal strength, no joint deformities Extremities: no clubbing cyanosis or edema, Homan's sign negative  Neurologic: grossly nonfocal; Cranial nerves grossly wnl Psychologic: Normal mood and affect  ECG (independently read by me): Sinus bradycardia 54 bpm.  PR interval 22 ms.  No ST segment changes.  June  2016 ECG (independently read by me): Sinus bradycardia with sinus arrhythmia at 57 bpm.  Normal intervals.  No ST segment changes.  October 2014 ECG:  Sinus rhythm at 59 beats per minute. Nonspecific T changes.  LABS: BMP Latest Ref Rng & Units 08/04/2019 01/24/2019 01/23/2019  Glucose 70 - 99 mg/dL 88 96 97  BUN 6 - 23 mg/dL 19 14 14   Creatinine 0.40 - 1.50 mg/dL 1.11 1.11 1.30(H)  BUN/Creat Ratio 10 - 22 - - -  Sodium 135 - 145 mEq/L 141 138 139  Potassium 3.5 - 5.1 mEq/L 4.2 3.7 4.4  Chloride 96 - 112 mEq/L 107 109 108  CO2 19 - 32 mEq/L 27 21(L) 25  Calcium 8.4 - 10.5 mg/dL 9.4 8.3(L) 9.1     Hepatic Function Latest Ref Rng & Units 08/04/2019 06/28/2018 05/02/2015  Total Protein 6.0 - 8.3 g/dL 6.8 7.3 6.7  Albumin 3.5 - 5.2 g/dL 4.1 4.4 4.4  AST 0 - 37 U/L 25 17 17   ALT 0 - 53 U/L 24 15 36  Alk Phosphatase 39 - 117 U/L 90 75 63  Total Bilirubin 0.2 - 1.2 mg/dL 0.8 0.6 0.5   CBC Latest Ref Rng & Units 08/04/2019 01/24/2019 01/23/2019  WBC 4.0 - 10.5 K/uL 6.9 7.5 7.8  Hemoglobin 13.0 - 17.0 g/dL 14.7 14.5 15.8  Hematocrit 39.0 - 52.0 % 44.0 42.6 46.3  Platelets 150.0 - 400.0 K/uL 159.0 137(L) 158   Lab Results  Component Value Date   MCV 89.5 08/04/2019   MCV 88.4 01/24/2019   MCV 89.4 01/23/2019   Lab Results  Component Value Date   HGBA1C 5.0 01/23/2019   Lipid Panel     Component Value Date/Time   CHOL 135 08/04/2019 0851   TRIG 145.0 08/04/2019 0851   HDL 35.30 (L) 08/04/2019 0851   CHOLHDL 4 08/04/2019 0851   VLDL 29.0 08/04/2019 0851   LDLCALC 71 08/04/2019 0851   LDLDIRECT 127.0 06/28/2018 0846   RADIOLOGY: No results found.  IMPRESSION:  1. CAD S/P percutaneous coronary angioplasty   2. NSTEMI (non-ST elevated myocardial infarction) ([place]): 01/23/2019   3. Hyperlipidemia with target LDL less than 70   4. Hyperlipidemia, unspecified hyperlipidemia type   5. Coronary artery disease involving native coronary artery of native heart without angina pectoris      ASSESSMENT AND PLAN: Mr. Dallman is a 74 year-old gentleman who is status post PTCA and stenting of his RCA in 1999. A nuclear study in 2014 continued to show normal perfusion.  He had a remote history of smoking but quit smoking at age 34.  Since I last saw him in October 2018 he developed unstable angina and ruled in for non-STEMI with ECG changes evolving into an ST segment elevation myocardial infarction for which he underwent emergent catheterization on January 23, 2019.  He underwent successful stenting of his 99% proximal LAD stenosis just proximal to the prior stent with stent overlap to the previously placed bare-metal stent and stenting of a 75% LAD stenosis.  He had concomitant CAD with 40% mid LAD stenosis and 30% mid RCA stenoses.  He continues to be active.  He had been on DAPT with aspirin/Brilinta 90 mg twice a day until 1 month ago when this was discontinued.  With the patient's double stenting in his proximal RCA as well as new stent in distal RCA I have recommended continuation of DAPT.  For this reason I have recommended initiation of clopidogrel 75 mg daily to take in addition to his baby aspirin.  In 1 week I am recommending laboratory with a comprehensive metabolic panel,  CBC, lipid studies, TSH, and will also evaluate for Plavix responsiveness with P2Y12 testing.  He continues to be on atorvastatin 80 mg daily.  Target LDL is less than 70.  Weight is stable.  I will contact him regarding his laboratory and adjustments will be made to the regimen if necessary.  I will see him in 6 months for reevaluation or sooner as needed.   Troy Sine, MD, [facility]  03/25/2020 5:31 PM

## 2020-03-25 ENCOUNTER — Encounter: Payer: Self-pay | Admitting: Cardiovascular Disease

## 2020-04-05 ENCOUNTER — Other Ambulatory Visit (HOSPITAL_COMMUNITY)
Admission: RE | Admit: 2020-04-05 | Discharge: 2020-04-05 | Disposition: A | Discharge status: Home / Self Care | Payer: Medicare Other | Source: Ambulatory Visit | Attending: Cardiovascular Disease | Admitting: Cardiovascular Disease

## 2020-04-09 ENCOUNTER — Other Ambulatory Visit: Payer: Self-pay | Admitting: Pharmacist

## 2020-04-09 MED ORDER — ATORVASTATIN CALCIUM 40 MG PO TABS: 40.0000 mg | ORAL_TABLET | Freq: Every evening | ORAL | 1 refills | Status: DC

## 2020-08-21 ENCOUNTER — Encounter: Payer: Self-pay | Admitting: Family Medicine

## 2020-08-21 ENCOUNTER — Ambulatory Visit (INDEPENDENT_AMBULATORY_CARE_PROVIDER_SITE_OTHER): Payer: Medicare Other | Admitting: Family Medicine

## 2020-08-21 ENCOUNTER — Other Ambulatory Visit: Payer: Self-pay

## 2020-08-21 VITALS — BP 118/73 | HR 50 | Temp 97.6°F | Resp 16 | Ht 67.75 in | Wt 167.4 lb

## 2020-08-21 DIAGNOSIS — Z955 Presence of coronary angioplasty implant and graft: Secondary | ICD-10-CM

## 2020-08-21 DIAGNOSIS — Z122 Encounter for screening for malignant neoplasm of respiratory organs: Secondary | ICD-10-CM | POA: Diagnosis not present

## 2020-08-21 DIAGNOSIS — I2510 Atherosclerotic heart disease of native coronary artery without angina pectoris: Secondary | ICD-10-CM

## 2020-08-21 DIAGNOSIS — Z23 Encounter for immunization: Secondary | ICD-10-CM

## 2020-08-21 DIAGNOSIS — Z1211 Encounter for screening for malignant neoplasm of colon: Secondary | ICD-10-CM | POA: Diagnosis not present

## 2020-08-21 DIAGNOSIS — I252 Old myocardial infarction: Secondary | ICD-10-CM

## 2020-08-21 DIAGNOSIS — Z0000 Encounter for general adult medical examination without abnormal findings: Secondary | ICD-10-CM

## 2020-08-21 LAB — COMPREHENSIVE METABOLIC PANEL
ALT: 17 U/L (ref 0–53)
AST: 19 U/L (ref 0–37)
Albumin: 4 g/dL (ref 3.5–5.2)
Alkaline Phosphatase: 94 U/L (ref 39–117)
BUN: 14 mg/dL (ref 6–23)
CO2: 27 mEq/L (ref 19–32)
Calcium: 8.9 mg/dL (ref 8.4–10.5)
Chloride: 105 mEq/L (ref 96–112)
Creatinine, Ser: 1.1 mg/dL (ref 0.40–1.50)
GFR: 65.76 mL/min (ref >60.00)
Glucose, Bld: 80 mg/dL (ref 70–99)
Potassium: 4.4 mEq/L (ref 3.5–5.1)
Sodium: 139 mEq/L (ref 135–145)
Total Bilirubin: 0.8 mg/dL (ref 0.2–1.2)
Total Protein: 6.7 g/dL (ref 6.0–8.3)

## 2020-08-21 LAB — CBC
HCT: 43.8 % (ref 39.0–52.0)
Hemoglobin: 14.7 g/dL (ref 13.0–17.0)
MCHC: 33.6 g/dL (ref 30.0–36.0)
MCV: 90 fl (ref 78.0–100.0)
Platelets: 184 10*3/uL (ref 150.0–400.0)
RBC: 4.87 Mil/uL (ref 4.22–5.81)
RDW: 13.9 % (ref 11.5–15.5)
WBC: 7.1 10*3/uL (ref 4.0–10.5)

## 2020-08-21 LAB — LIPID PANEL
Cholesterol: 155 mg/dL (ref 0–200)
HDL: 35.1 mg/dL — ABNORMAL LOW (ref >39.00)
LDL Cholesterol: 97 mg/dL (ref 0–99)
NonHDL: 120.31
Total CHOL/HDL Ratio: 4
Triglycerides: 118 mg/dL (ref 0.0–149.0)
VLDL: 23.6 mg/dL (ref 0.0–40.0)

## 2020-08-21 LAB — TSH: TSH: 1.24 u[IU]/mL (ref 0.35–4.50)

## 2020-08-21 MED ORDER — ZOSTER VAC RECOMB ADJUVANTED 50 MCG/0.5ML IM SUSR: 0.5000 mL | Freq: Once | INTRAMUSCULAR | 1 refills | Status: AC

## 2020-08-21 NOTE — Addendum Note (Signed)
Addended by: Jeoffrey Massed on: 08/21/2020 10:29 AM   Modules accepted: Orders

## 2020-08-21 NOTE — Patient Instructions (Signed)

## 2020-08-21 NOTE — Progress Notes (Signed)
Office Note 08/21/2020  CC:  Chief Complaint  Patient presents with  . Annual Exam    pt is fasting    HPI:  Patrick Price is a 74 y.o. White male who is here for annual health maintenance exam, f/u CRI III, and HLD. A/P as of last visit: "Health maintenance exam: Reviewed age and gender appropriate health maintenance issues (prudent diet, regular exercise, health risks of tobacco and excessive alcohol, use of seatbelts, fire alarms in home, use of sunscreen).  Also reviewed age and gender appropriate health screening as well as vaccine recommendations. Vaccines: Flu ->given today.   Pneumovax 23->given today.   Shingrix rx sent to [facility] b/c no pharmacy had any in stock last year. Labs: FLP, CBC, CMET--lab appt made for 08/04/19. Prostate ca screening: no further prostate screening. Colon ca screening: hx of normal colonoscopy.  FIT normal 03/2018-->repeat FIT today. Lung ca screening: next screening CT due 11/2019."  INTERIM HX: Dong well. Walks dog 3-4 miles per day, is NOT sedentary. Pretty much eats what he wants. 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  He did not get a rpt lung ca screening CT since last visit.  CRI: tries to hydrate well. Avoids NSAIDs.  Past Medical History:  Diagnosis Date  . Ataxia 03/09/2015  . Bell's palsy    left  . CAD S/P BMS PCI-RCA 1999; 01/2019   a) 1999 - BMS PCI RCA (in [place]. Followed by Dr. Tresa EndoKelly); [id number])2016: nuclear stress test- low risk scan. no ischemia or infarct/scar is seen EF 77%. c) Mar 2020->2 Resolute Onyx DES stents to RCA, EF 55-60%, inf hypokin, elev LVEDP  . CAP (community acquired pneumonia) 07/2018   Noted on noncontrast, low dose CT done for lung ca screening.  . Chronic renal insufficiency, stage 3 (moderate) ([facility]) 2019   GFR 50s  . Colon cancer screening 2019   FIT test neg 03/21/18  . Hyperlipidemia    Recommended pt restart atorva 40 mg qd 06/28/18 but he declined, stating he had intol to rosuva and  atorva (?cough? per pt).  . Hypertension    pt adamantly denies this dx  . Loss of touch sensation on examination 03/09/2015  . Polyneuropathy 03/2015   NCS/EMG 2016: symmetric length-dependent moderately severe axonal sensorimotor polyneuropathy (no ulnar or median neuropathy, no radiculopathy).  . STEMI (ST elevation myocardial infarction) ([facility]) 01/2019   2 Resolute Onyx DES stents to RCA: ASA + plavix, statin.  (HR too low for BB, plus pt refused ARB).  . Tobacco abuse    Quit 03/2018.  (30+ pack-yr cigs).    Past Surgical History:  Procedure Laterality Date  . CARDIOVASCULAR STRESS TEST  2016   Myocard perf imaging: normal (EF/LV fxn normal, no ischemia)  . COLONOSCOPY  06/2006; 2014   2014 NORMAL.  Recall 10 yrs Deboraha Sprang([place]).  FIT test NEG 03/21/18.  . CORONARY ANGIOPLASTY WITH STENT PLACEMENT  1999   RCA stent 1999 Claris Gower([place])  . CORONARY/GRAFT ACUTE MI REVASCULARIZATION N/A 01/23/2019   2 Resolute Onyx DES stents to RCA. Procedure: Coronary/Graft Acute MI Revascularization;  Surgeon: Marykay LexHarding, David W, MD;  [facility];  Service: Cardiovascular;  Laterality: N/A;  . LEFT HEART CATH AND CORONARY ANGIOGRAPHY N/A 01/23/2019   2 Resolute Onyx DES stents to RCA. Procedure: LEFT HEART CATH AND CORONARY ANGIOGRAPHY;  Surgeon: Marykay LexHarding, David W, MD;  [facility];  Service: Cardiovascular;  Laterality: N/A;    Family History  Problem Relation Age  of Onset  . Heart attack Mother   . Cancer Neg Hx   . Diabetes Neg Hx     Social History   Socioeconomic History  . Marital status: Married    Spouse name: Not on file  . Number of children: 2  . Years of education: 46  . Highest education level: Not on file  Occupational History  . Occupation: Research officer, trade union- unemployed currently   Tobacco Use  . Smoking status: Former Smoker    Packs/day: 0.10    Years: 50.00    Pack years: 5.00    Types: Cigarettes    Quit date: 02/15/2007    Years since quitting:  13.5  . Smokeless tobacco: Never Used  Vaping Use  . Vaping Use: Former  Substance and Sexual Activity  . Alcohol use: Yes    Alcohol/week: 0.0 standard drinks    Comment: rare, 3-4 x a yr  . Drug use: No  . Sexual activity: Not on file  Other Topics Concern  . Not on file  Social History Narrative   Married, one son and one daughter.   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   50 pack-yr hx.  Quit x 10 yrs, then restarted 2018.   No alcohol.         2-3 cups of caffeine daily   Social Determinants of Health   Financial Resource Strain:   . Difficulty of Paying Living Expenses: Not on file  Food Insecurity:   . Worried About Programme researcher, broadcasting/film/video in the Last Year: Not on file  . Ran Out of Food in the Last Year: Not on file  Transportation Needs:   . Lack of Transportation (Medical): Not on file  . Lack of Transportation (Non-Medical): Not on file  Physical Activity:   . Days of Exercise per Week: Not on file  . Minutes of Exercise per Session: Not on file  Stress:   . Feeling of Stress : Not on file  Social Connections:   . Frequency of Communication with Friends and Family: Not on file  . Frequency of Social Gatherings with Friends and Family: Not on file  . Attends Religious Services: Not on file  . Active Member of Clubs or Organizations: Not on file  . Attends Banker Meetings: Not on file  . Marital Status: Not on file  Intimate Partner Violence:   . Fear of Current or Ex-Partner: Not on file  . Emotionally Abused: Not on file  . Physically Abused: Not on file  . Sexually Abused: Not on file    Outpatient Medications Prior to Visit  Medication Sig Dispense Refill  . aspirin EC 81 MG EC tablet Take 1 tablet (81 mg total) by mouth daily. 90 tablet 3  . atorvastatin (LIPITOR) 40 MG tablet Take 1 tablet (40 mg total) by mouth every evening. 90 tablet 1  . clopidogrel (PLAVIX) 75 MG tablet Take 1  tablet (75 mg total) by mouth daily. 90 tablet 3  . nitroGLYCERIN (NITROSTAT) 0.4 MG SL tablet Place 1 tablet (0.4 mg total) under the tongue every 5 (five) minutes x 3 doses as needed for chest pain. (Patient not taking: Reported on 08/21/2020) 25 tablet 3   No facility-administered medications prior to visit.    No Known Allergies  ROS Review of Systems  Constitutional: Negative for appetite change, chills, fatigue and fever.  HENT: Negative for congestion,  dental problem, ear pain and sore throat.   Eyes: Negative for discharge, redness and visual disturbance.  Respiratory: Negative for cough, chest tightness, shortness of breath and wheezing.   Cardiovascular: Negative for chest pain, palpitations and leg swelling.  Gastrointestinal: Negative for abdominal pain, blood in stool, diarrhea, nausea and vomiting.  Genitourinary: Negative for difficulty urinating, dysuria, flank pain, frequency, hematuria and urgency.  Musculoskeletal: Negative for arthralgias, back pain, joint swelling, myalgias and neck stiffness.  Skin: Negative for pallor and rash.  Neurological: Negative for dizziness, speech difficulty, weakness and headaches.  Hematological: Negative for adenopathy. Does not bruise/bleed easily.  Psychiatric/Behavioral: Negative for confusion and sleep disturbance. The patient is not nervous/anxious.     PE; Vitals with BMI 08/21/2020 03/23/2020 02/29/2020  Height 5' 7.75" 5\' 9"  5\' 9"   Weight 167 lbs 6 oz 169 lbs 13 oz 169 lbs  BMI 25.64 25.06 24.95  Systolic 118 116  Diastolic 73 68 70  Pulse 50 60 58     Gen: Alert, well appearing.  Patient is oriented to person, place, time, and situation. AFFECT: pleasant, lucid thought and speech. ENT: Ears: EACs clear, normal epithelium.  TMs with good light reflex and landmarks bilaterally.  Eyes: no injection, icteris, swelling, or exudate.  EOMI, PERRLA. Nose: no drainage or turbinate edema/swelling.  No injection or focal  lesion.  Mouth: lips without lesion/swelling.  Oral mucosa pink and moist.  Dentition intact and without obvious caries or gingival swelling.  Oropharynx without erythema, exudate, or swelling.  Neck: supple/nontender.  No LAD, mass, or TM.  Carotid pulses 2+ bilaterally, without bruits. CV: RRR, no m/r/g.   LUNGS: CTA bilat, nonlabored resps, good aeration in all lung fields. ABD: soft, NT, ND, BS normal.  No hepatospenomegaly or mass.  No bruits. EXT: no clubbing, cyanosis, or edema.  Musculoskeletal: no joint swelling, erythema, warmth, or tenderness.  ROM of all joints intact. Skin - no sores or suspicious lesions or rashes or color changes   Pertinent labs:  Lab Results  Component Value Date   TSH 0.939 02/25/2015   Lab Results  Component Value Date   WBC 6.9 08/04/2019   HGB 14.7 08/04/2019   HCT 44.0 08/04/2019   MCV 89.5 08/04/2019   PLT 159.0 08/04/2019   Lab Results  Component Value Date   CREATININE 1.11 08/04/2019   BUN 19 08/04/2019   NA 141 08/04/2019   K 4.2 08/04/2019   CL 107 08/04/2019   CO2 27 08/04/2019   Lab Results  Component Value Date   ALT 24 08/04/2019   AST 25 08/04/2019   ALKPHOS 90 08/04/2019   BILITOT 0.8 08/04/2019   Lab Results  Component Value Date   CHOL 135 08/04/2019   Lab Results  Component Value Date   HDL 35.30 (L) 08/04/2019   Lab Results  Component Value Date   LDLCALC 71 08/04/2019   Lab Results  Component Value Date   TRIG 145.0 08/04/2019   Lab Results  Component Value Date   CHOLHDL 4 08/04/2019   Lab Results  Component Value Date   PSA 0.24 02/25/2015   Lab Results  Component Value Date   HGBA1C 5.0 01/23/2019   ASSESSMENT AND PLAN:   1) HLD: compliant with atorva 40mg  qd, no side effects.  2) CRI: avoids NSAIDs and works on hydrating well.  3) Health maintenance exam: Reviewed age and gender appropriate health maintenance issues (prudent diet, regular exercise, health risks of tobacco and  excessive alcohol, use  of seatbelts, fire alarms in home, use of sunscreen).  Also reviewed age and gender appropriate health screening as well as vaccine recommendations. Vaccines: All UTD, including covid 19+ booster.  Flu-->already UTD.  Shingrix rx printed for pt to take to pharmacy. Labs: fasting HP labs ordered--will forward results to Dr. Tresa Endo, who pt will arrange f/u with soon.  Prostate ca screening: no further prostate ca screening decided upon via shared decision-making process. Colon ca screening: no hx of polyps.  NEG FIT 11/01/19.  Rpt approx 11/2020--ordered today.  An After Visit Summary was printed and given to the patient.  FOLLOW UP:  Return in about 1 year (around 08/21/2021) for annual CPE (fasting).  Signed:  Santiago Bumpers, MD           08/21/2020

## 2020-10-05 ENCOUNTER — Other Ambulatory Visit: Payer: Self-pay | Admitting: Cardiovascular Disease

## 2020-11-28 ENCOUNTER — Other Ambulatory Visit: Payer: Self-pay

## 2020-11-28 ENCOUNTER — Encounter: Payer: Self-pay | Admitting: Cardiovascular Disease

## 2020-11-28 ENCOUNTER — Ambulatory Visit: Payer: Medicare Other | Admitting: Cardiovascular Disease

## 2020-11-28 DIAGNOSIS — Z79899 Other long term (current) drug therapy: Secondary | ICD-10-CM | POA: Diagnosis not present

## 2020-11-28 DIAGNOSIS — Z9861 Coronary angioplasty status: Secondary | ICD-10-CM | POA: Diagnosis not present

## 2020-11-28 DIAGNOSIS — E785 Hyperlipidemia, unspecified: Secondary | ICD-10-CM

## 2020-11-28 DIAGNOSIS — I214 Non-ST elevation (NSTEMI) myocardial infarction: Secondary | ICD-10-CM | POA: Diagnosis not present

## 2020-11-28 DIAGNOSIS — I2510 Atherosclerotic heart disease of native coronary artery without angina pectoris: Secondary | ICD-10-CM

## 2020-11-28 MED ORDER — EZETIMIBE 10 MG PO TABS: 10.0000 mg | ORAL_TABLET | Freq: Every day | ORAL | 12 refills | Status: DC

## 2020-11-28 NOTE — Patient Instructions (Signed)
Medication Instructions:  START EZETIMIBE 10MG  DAILY   *If you need a refill on your cardiac medications before your next appointment, please call your pharmacy*  Lab Work: FASTING LIPID AND CMET IN 4 MONTHS  If you have labs (blood work) drawn today and your tests are completely normal, you will receive your results only by:  MyChart Message (if you have MyChart) OR A paper copy in the mail.  If you have any lab test that is abnormal or we need to change your treatment, we will call you to review the results. You may go to any [facility] that is convenient for you however, we do have a lab in our office that is able to assist you. You DO NOT need an appointment for our lab. The lab is open 8:00am and closes at 4:00pm. Lunch 12:45 - 1:45pm.  Follow-Up: Your next appointment:  12 month(s) In Person with You may see , MD or one of the following Advanced Practice Providers on your designated Care Team:  Nicki Gu[place], PA-C  Azalee Course, Micah Flesher or  [place], PA-C  Please call our office 2 months in advance to schedule this appointment   At [facility], you and your health needs are our priority.  As part of our continuing mission to provide you with exceptional heart care, we have created designated Provider Care Teams.  These Care Teams include your primary Cardiologist (physician) and Advanced Practice Providers (APPs -  Physician Assistants and Nurse Practitioners) who all work together to provide you with the care you need, when you need it.

## 2020-11-28 NOTE — Progress Notes (Unsigned)
Patient ID: Patrick Price, male   DOB: 01-03-1946, 75 y.o.   MRN: [id number]     HPI: Patrick Price is a 75 y.o. male who presents for an 8 month cardiology evaluation.  Patrick Price has established CAD underwent PTCA and stenting of his RCA in 1999. He has a long-standing history of prior tobacco use but quit smoking  when he turns 60. He does have a history of hyperlipidemia. A two-year followup nuclear perfusion study in May 2014  continued to demonstrate normal perfusion without scar or ischemia on his medical therapy.  He has a history of hyperlipidemia and had been on atorvastatin 40 mg daily in addition to fish oil.  He has been on Toprol-XL 25 mg daily.  In March 2016 he developed Bell's palsy.  Ultimately this improved.  However, 2 weeks later he developed some numbness of his hands and feet, lips and tongue.  He was evaluated by neurology and was started on gabapentin for his peripheral neuropathy.  When I last saw me was on a tapering steroid dose.  When I saw him last in October 2018 after not having seen him for 2 years previously he continued to feel well from a cardiac standpoint.   He had lost over 30 pounds.  He was exercises regularly.  He was working 6 days per week at [employer] in [employer].  Inspection.  He had stopped all his medications for the past year and a half.  He states he has felt well off the medications.  He no longer has peripheral neuropathy symptoms.  He denies any episodes of chest tightness.  He denies shortness of breath.  He was walking his dog at least a mile several days per week.   Since I last saw him, in March 2020 he developed recurrent chest pain and after initially being admitted as a non-STEMI his ECG suggested changes of an evolving STEMI.  He was taken urgently to the Cath Lab by Dr. Ellyn Hack and was found to have 99% proximal RCA stenosis treated with DES stenting.  There was a 75% distal RCA lesion also treated with DES stenting.  EF was 50 to 55% by  left-ventricular atrophy.  At discharge losartan was added to his medicine regimen but the patient refused.  He apparently had done well and was seen by Almyra Deforest, PA on February 29, 2020.  During his evaluation he was feeling well and was walking at least 3 miles per day.  At that time he was taken off Brilinta and continues to be on Lipitor 80 mg daily for hyperlipidemia.  I last saw him on Mar 23, 2020. He continues to work in [employer]. He was without chest pain. He has remained active and was walking 3 to 4 miles per day. He had a Covid infection in August 2000 and had lost his sense of taste and smell and had marked fatigability which ultimately resolved. I recommended follow-up laboratory.  Since I last saw him, he continues to feel well. He continues to walk still 3 to 4 miles per day. He self reduced his atorvastatin dose from 80 mg down to 40 mg. Lipid studies in October 2021 showed total cholesterol 155, HDL 35, LDL had increased to 97. He continues to be on DAPT with aspirin/Plavix. He has not required any sublingual nitroglycerin use. He presents for evaluation.  Past Medical History:  Diagnosis Date  . Ataxia 03/09/2015  . Bell's palsy    left  .  CAD S/P BMS PCI-RCA 1999; 01/2019   a) 1999 - BMS PCI RCA (in [place]. Followed by Dr. Claiborne Billings); [id number]: nuclear stress test- low risk scan. no ischemia or infarct/scar is seen EF 77%. c) Mar 2020->2 Resolute Onyx DES stents to RCA, EF 55-60%, inf hypokin, elev LVEDP  . CAP (community acquired pneumonia) 07/2018   Noted on noncontrast, low dose CT done for lung ca screening.  . Chronic renal insufficiency, stage 3 (moderate) (HCC) 2019   GFR 50s  . Colon cancer screening 2019   FIT test neg 03/21/18  . Hyperlipidemia    Recommended pt restart atorva 40 mg qd 06/28/18 but he declined, stating he had intol to rosuva and atorva (?cough? per pt).  . Hypertension    pt adamantly denies this dx  . Loss of touch sensation on examination  03/09/2015  . Polyneuropathy 03/2015   NCS/EMG 2016: symmetric length-dependent moderately severe axonal sensorimotor polyneuropathy (no ulnar or median neuropathy, no radiculopathy).  . STEMI (ST elevation myocardial infarction) ([place]) 01/2019   2 Resolute Onyx DES stents to RCA: ASA + plavix, statin.  (HR too low for BB, plus pt refused ARB).  . Tobacco abuse    Quit 03/2018.  (30+ pack-yr cigs).    Past Surgical History:  Procedure Laterality Date  . CARDIOVASCULAR STRESS TEST  2016   Myocard perf imaging: normal (EF/LV fxn normal, no ischemia)  . COLONOSCOPY  06/2006; 2014   2014 NORMAL.  Recall 10 yrs Sadie Haber).  FIT test NEG 03/21/18.  . CORONARY ANGIOPLASTY WITH STENT PLACEMENT  1999   RCA stent 1999 Baldo Ash)  . CORONARY/GRAFT ACUTE MI REVASCULARIZATION N/A 01/23/2019   2 Resolute Onyx DES stents to RCA. Procedure: Coronary/Graft Acute MI Revascularization;  Surgeon: Leonie Man, MD;  Location: [place] [facility];  Service: Cardiovascular;  Laterality: N/A;  . LEFT HEART CATH AND CORONARY ANGIOGRAPHY N/A 01/23/2019   2 Resolute Onyx DES stents to RCA. Procedure: LEFT HEART CATH AND CORONARY ANGIOGRAPHY;  Surgeon: Leonie Man, MD;  Location: [place] [facility];  Service: Cardiovascular;  Laterality: N/A;    No Known Allergies  Current Outpatient Medications  Medication Sig Dispense Refill  . aspirin EC 81 MG EC tablet Take 1 tablet (81 mg total) by mouth daily. 90 tablet 3  . atorvastatin (LIPITOR) 40 MG tablet TAKE 1 TABLET BY MOUTH EVERY DAY IN THE EVENING 90 tablet 2  . clopidogrel (PLAVIX) 75 MG tablet Take 1 tablet (75 mg total) by mouth daily. 90 tablet 3  . ezetimibe (ZETIA) 10 MG tablet Take 1 tablet (10 mg total) by mouth daily. 30 tablet 12  . nitroGLYCERIN (NITROSTAT) 0.4 MG SL tablet Place 1 tablet (0.4 mg total) under the tongue every 5 (five) minutes x 3 doses as needed for chest pain. 25 tablet 3   No current facility-administered medications for this visit.     Social History   Socioeconomic History  . Marital status: Married    Spouse name: Not on file  . Number of children: 2  . Years of education: 83  . Highest education level: Not on file  Occupational History  . Occupation: Media planner- unemployed currently   Tobacco Use  . Smoking status: Former Smoker    Packs/day: 0.10    Years: 50.00    Pack years: 5.00    Types: Cigarettes    Quit date: 02/15/2007    Years since quitting: 13.8  . Smokeless tobacco: Never Used  Vaping Use  .  Vaping Use: Former  Substance and Sexual Activity  . Alcohol use: Yes    Alcohol/week: 0.0 standard drinks    Comment: rare, 3-4 x a yr  . Drug use: No  . Sexual activity: Not on file  Other Topics Concern  . Not on file  Social History Narrative   Married, one son and one daughter.   Educ: HS   OccupCatering manager   No current tob but is vaping to try to quit cigs.   50 pack-yr hx.  Quit x 10 yrs, then restarted 2018.   No alcohol.         2-3 cups of caffeine daily   Social Determinants of Health   Financial Resource Strain: Not on file  Food Insecurity: Not on file  Transportation Needs: Not on file  Physical Activity: Not on file  Stress: Not on file  Social Connections: Not on file  Intimate Partner Violence: Not on file   Socially he is married and has 2 children.  Family History  Problem Relation Age of Onset  . Heart attack Mother   . Cancer Neg Hx   . Diabetes Neg Hx    ROS General: Negative; No fevers, chills, or night sweats;  HEENT: Negative; No changes in vision or hearing, sinus congestion, difficulty swallowing Pulmonary: Negative; No cough, wheezing, shortness of breath, hemoptysis Cardiovascular: See HPI GI: Negative; No nausea, vomiting, diarrhea, or abdominal pain GU: Negative; No dysuria, hematuria, or difficulty voiding Musculoskeletal: Negative; no myalgias, joint pain, or weakness Hematologic/Oncology: Negative; no easy bruising,  bleeding Endocrine: Negative; no heat/cold intolerance; no diabetes Neuro: Recent Bell's palsy.  Recent development of peripheral neuropathy. Skin: Negative; No rashes or skin lesions Psychiatric: Negative; No behavioral problems, depression Sleep: Negative; No snoring, daytime sleepiness, hypersomnolence, bruxism, restless legs, hypnogognic hallucinations, no cataplexy Other comprehensive 14 point system review is negative.  PE BP 120/72 (BP Location: Left Arm, Patient Position: Sitting)   Pulse 65   Ht _0  (1.727 m)   Wt 172 lb 9.6 oz (78.3 kg)   SpO2 98%   BMI 26.24 kg/m    Repeat blood pressure by me was 120/74  Wt Readings from Last 3 Encounters:  11/28/20 172 lb 9.6 oz (78.3 kg)  08/21/20 167 lb 6.4 oz (75.9 kg)  03/23/20 169 lb 12.8 oz (77 kg)   General: Alert, oriented, no distress.  Skin: normal turgor, no rashes, warm and dry HEENT: Normocephalic, atraumatic. Pupils equal round and reactive to light; sclera anicteric; extraocular muscles intact;  Nose without nasal septal hypertrophy Mouth/Parynx benign; Mallinpatti scale 2 Neck: No JVD, no carotid bruits; normal carotid upstroke Lungs: clear to ausculatation and percussion; no wheezing or rales Chest wall: without tenderness to palpitation Heart: PMI not displaced, RRR, s1 s2 normal, 1/6 systolic murmur, no diastolic murmur, no rubs, gallops, thrills, or heaves Abdomen: soft, nontender; no hepatosplenomehaly, BS+; abdominal aorta nontender and not dilated by palpation. Back: no CVA tenderness Pulses 2+ Musculoskeletal: full range of motion, normal strength, no joint deformities Extremities: no clubbing cyanosis or edema, Homan's sign negative  Neurologic: grossly nonfocal; Cranial nerves grossly wnl Psychologic: Normal mood and affect   May 2021 ECG (independently read by me): Sinus bradycardia 54 bpm.  PR interval 22 ms.  No ST segment changes.  June 2016 ECG (independently read by me): Sinus bradycardia with  sinus arrhythmia at 57 bpm.  Normal intervals.  No ST segment changes.  October 2014 ECG:  Sinus rhythm at 59 beats per  minute. Nonspecific T changes.  LABS: BMP Latest Ref Rng & Units 08/21/2020 08/04/2019 01/24/2019  Glucose 70 - 99 mg/dL 80 88 96  BUN 6 - 23 mg/dL _0 Creatinine 0.40 - 1.50 mg/dL 1.10 1.11 1.11  BUN/Creat Ratio 10 - 22 - - -  Sodium 135 - 145 mEq/L 139 141 138  Potassium 3.5 - 5.1 mEq/L 4.4 4.2 3.7  Chloride 96 - 112 mEq/L 105 107 109  CO2 19 - 32 mEq/L 27 27 21(L)  Calcium 8.4 - 10.5 mg/dL 8.9 9.4 8.3(L)     Hepatic Function Latest Ref Rng & Units 08/21/2020 08/04/2019 06/28/2018  Total Protein 6.0 - 8.3 g/dL 6.7 6.8 7.3  Albumin 3.5 - 5.2 g/dL 4.0 4.1 4.4  AST 0 - 37 U/L _1 ALT 0 - 53 U/L _2 Alk Phosphatase 39 - 117 U/L 94 90 75  Total Bilirubin 0.2 - 1.2 mg/dL 0.8 0.8 0.6   CBC Latest Ref Rng & Units 08/21/2020 08/04/2019 01/24/2019  WBC 4.0 - 10.5 K/uL 7.1 6.9 7.5  Hemoglobin 13.0 - 17.0 g/dL 14.7 14.7 14.5  Hematocrit 39.0 - 52.0 % 43.8 44.0 42.6  Platelets 150.0 - 400.0 K/uL 184.0 159.0 137(L)   Lab Results  Component Value Date   MCV 90.0 08/21/2020   MCV 89.5 08/04/2019   MCV 88.4 01/24/2019   Lab Results  Component Value Date   HGBA1C 5.0 01/23/2019   Lipid Panel     Component Value Date/Time   CHOL 155 08/21/2020 1032   TRIG 118.0 08/21/2020 1032   HDL 35.10 (L) 08/21/2020 1032   CHOLHDL 4 08/21/2020 1032   VLDL 23.6 08/21/2020 1032   LDLCALC 97 08/21/2020 1032   LDLDIRECT 127.0 06/28/2018 0846   RADIOLOGY: No results found.  IMPRESSION:  1. CAD S/P PCI to RCA 1999   2. NSTEMI (non-ST elevated myocardial infarction) [facility]): March 2020 PCI to LAD   3. Hyperlipidemia with target LDL less than 70   4. Medication management     ASSESSMENT AND PLAN: Patrick Price is a 75 year-old gentleman who is status post PTCA and stenting of his RCA in 1999. A nuclear study in 2014 continued to show normal perfusion.  He had a  remote history of smoking but quit smoking at age 72.  He developed unstable angina and ruled in for non-STEMI with ECG changes evolving into an ST segment elevation myocardial infarction for which he underwent emergent catheterization on January 23, 2019.  He underwent successful stenting of his 99% proximal LAD stenosis just proximal to the prior stent with stent overlap to the previously placed bare-metal stent and stenting of a 75% LAD stenosis.  He had concomitant CAD with 40% mid LAD stenosis and 30% mid RCA stenoses.  He continues to be active.  He had been on DAPT with aspirin/Brilinta 90 mg twice a day until April 2021 and Brilinta was discontinued by Almyra Deforest, PA. However when I saw him in follow-up due to his double stenting in his proximal RCA as well as new stent in distal RCA I recommended continuation of DAPT. At that time he was started on clopidogrel with subsequent P2 Y 12 testing to ensure responsiveness. Since I last saw him, he self reduced his atorvastatin dose to just 40 mg. Laboratory in October showed an increase in his LDL cholesterol to 97. I have recommended the initiation of Zetia 10 mg to take with his atorvastatin 40 mg and attempt  to reach target LDL less than 70. He will undergo repeat comprehensive metabolic panel and lipid studies in approximately 3 to 4 months and I will contact him regarding results. Otherwise he is doing well and is without anginal symptoms. I will see him in 1 year for reevaluation or sooner as needed.    Troy Sine, MD, [facility]  11/30/2020 4:52 PM

## 2020-11-30 ENCOUNTER — Encounter: Payer: Self-pay | Admitting: Cardiovascular Disease

## 2021-03-12 ENCOUNTER — Other Ambulatory Visit: Payer: Self-pay | Admitting: Cardiovascular Disease

## 2021-07-22 ENCOUNTER — Telehealth: Payer: Self-pay

## 2021-07-22 NOTE — Telephone Encounter (Signed)
LVM for pt to CB and schedule Medicare Annual Wellness Visit (AWV).   ?

## 2021-07-25 ENCOUNTER — Other Ambulatory Visit: Payer: Self-pay | Admitting: Cardiovascular Disease

## 2021-08-07 ENCOUNTER — Other Ambulatory Visit: Payer: Self-pay | Admitting: Cardiovascular Disease

## 2021-12-04 ENCOUNTER — Other Ambulatory Visit: Payer: Self-pay | Admitting: Cardiovascular Disease

## 2022-01-27 ENCOUNTER — Ambulatory Visit: Payer: Medicare Other | Admitting: Cardiovascular Disease

## 2022-01-27 ENCOUNTER — Other Ambulatory Visit: Payer: Self-pay

## 2022-01-27 ENCOUNTER — Encounter: Payer: Self-pay | Admitting: Cardiovascular Disease

## 2022-01-27 VITALS — BP 108/56 | HR 54 | Ht 68.0 in | Wt 170.2 lb

## 2022-01-27 DIAGNOSIS — I214 Non-ST elevation (NSTEMI) myocardial infarction: Secondary | ICD-10-CM

## 2022-01-27 DIAGNOSIS — Z79899 Other long term (current) drug therapy: Secondary | ICD-10-CM | POA: Diagnosis not present

## 2022-01-27 DIAGNOSIS — E785 Hyperlipidemia, unspecified: Secondary | ICD-10-CM

## 2022-01-27 DIAGNOSIS — I2510 Atherosclerotic heart disease of native coronary artery without angina pectoris: Secondary | ICD-10-CM

## 2022-01-27 DIAGNOSIS — Z9861 Coronary angioplasty status: Secondary | ICD-10-CM

## 2022-01-27 NOTE — Progress Notes (Signed)
Patient ID: Patrick Price, male   DOB: 05-Sep-1946, 76 y.o.   MRN: [id number] ? ? ? ? ? ? ?HPI: Patrick Price is a 76 y.o. male who presents for an 60 month cardiology evaluation. ? ?[name] has established CAD underwent PTCA and stenting of his RCA in 1999. He has a long-standing history of prior tobacco use but quit smoking  when he turns 60. He does have a history of hyperlipidemia. A two-year followup nuclear perfusion study in May 2014  continued to demonstrate normal perfusion without scar or ischemia on his medical therapy. ? ?He has a history of hyperlipidemia and had been on atorvastatin 40 mg daily in addition to fish oil.  He has been on Toprol-XL 25 mg daily.  In March 2016 he developed Bell's palsy.  Ultimately this improved.  However, 2 weeks later he developed some numbness of his hands and feet, lips and tongue.  He was evaluated by neurology and was started on gabapentin for his peripheral neuropathy.  When I last saw me was on a tapering steroid dose. ? ?When I saw him last in October 2018 after not having seen him for 2 years previously he continued to feel well from a cardiac standpoint.   He had lost over 30 pounds.  He was exercises regularly.  He was working 6 days per week at [employer] in [place] [employer].  Inspection.  He had stopped all his medications for the past year and a half.  He states he has felt well off the medications.  He no longer has peripheral neuropathy symptoms.  He denies any episodes of chest tightness.  He denies shortness of breath.  He was walking his dog at least a mile several days per week.  ? ?Since I last saw him, in March 2020 he developed recurrent chest pain and after initially being admitted as a non-STEMI his ECG suggested changes of an evolving STEMI.  He was taken urgently to the Cath Lab by Dr. Ellyn Hack and was found to have 99% proximal RCA stenosis treated with DES stenting.  There was a 75% distal RCA lesion also treated with DES stenting.  EF was 50 to 55% by  left-ventricular atrophy.  At discharge losartan was added to his medicine regimen but the patient refused. ? ?He had done well and was seen by Almyra Deforest, PA on February 29, 2020.  During his evaluation he was feeling well and was walking at least 3 miles per day.  At that time he was taken off Brilinta and continues to be on Lipitor 80 mg daily for hyperlipidemia. ? ?I saw him on Mar 23, 2020. He continues to work in [employer]. He was without chest pain. He has remained active and was walking 3 to 4 miles per day. He had a Covid infection in August 2000 and had lost his sense of taste and smell and had marked fatigability which ultimately resolved. I recommended follow-up laboratory. ? ?I last saw him on November 28, 2020.  At that time he continued to feel well and was walking at least 3 to 4 miles per day.  He self reduced his atorvastatin dose from 80 mg down to 40 mg. Lipid studies in October 2021 showed total cholesterol 155, HDL 35, LDL had increased to 97. He continues to be on DAPT with aspirin/Plavix. He has not required any sublingual nitroglycerin use.  At that time, I recommended the addition of Zetia 10 mg to his atorvastatin 40  mg in attempt to reach target LDL less than 70. ? 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  He remains active and continues to walk 1 hour 4 days/week.  He denies chest pain or shortness of breath.  He will be seeing his primary physician Dr. Anitra Lauth next week and laboratory will be checked.  He presents for reevaluation. ? ?Past Medical History:  ?Diagnosis Date  ? Ataxia 03/09/2015  ? Bell's palsy   ? left  ? CAD S/P BMS PCI-RCA 1999; 01/2019  ? a) 1999 - BMS PCI RCA (in [place]. Followed by Dr. Claiborne Billings); [id number]: nuclear stress test- low risk scan. no ischemia or infarct/scar is seen EF 77%. c) Mar 2020->2 Resolute Onyx DES stents to RCA, EF 55-60%, inf hypokin, elev LVEDP  ? CAP (community acquired pneumonia) 07/2018  ? Noted on  noncontrast, low dose CT done for lung ca screening.  ? Chronic renal insufficiency, stage 3 (moderate) ([place]) 2019  ? GFR 50s  ? Colon cancer screening 2019  ? FIT test neg 03/21/18  ? Hyperlipidemia   ? Recommended pt restart atorva 40 mg qd 06/28/18 but he declined, stating he had intol to rosuva and atorva (?cough? per pt).  ? Hypertension   ? pt adamantly denies this dx  ? Loss of touch sensation on examination 03/09/2015  ? Polyneuropathy 03/2015  ? NCS/EMG 2016: symmetric length-dependent moderately severe axonal sensorimotor polyneuropathy (no ulnar or median neuropathy, no radiculopathy).  ? STEMI (ST elevation myocardial infarction) ([place]) 01/2019  ? 2 Resolute Onyx DES stents to RCA: ASA + plavix, statin.  (HR too low for BB, plus pt refused ARB).  ? Tobacco abuse   ? Quit 03/2018.  (30+ pack-yr cigs).  ? ? ?Past Surgical History:  ?Procedure Laterality Date  ? CARDIOVASCULAR STRESS TEST  2016  ? Myocard perf imaging: normal (EF/LV fxn normal, no ischemia)  ? COLONOSCOPY  06/2006; 2014  ? 2014 NORMAL.  Recall 10 yrs Sadie Haber).  FIT test NEG 03/21/18.  ? CORONARY ANGIOPLASTY WITH STENT PLACEMENT  1999  ? RCA stent 1999 Baldo Ash)  ? CORONARY/GRAFT ACUTE MI REVASCULARIZATION N/A 01/23/2019  ? 2 Resolute Onyx DES stents to RCA. Procedure: Coronary/Graft Acute MI Revascularization;  Surgeon: Leonie Man, MD;  Location: [place] [facility];  Service: Cardiovascular;  Laterality: N/A;  ? LEFT HEART CATH AND CORONARY ANGIOGRAPHY N/A 01/23/2019  ? 2 Resolute Onyx DES stents to RCA. Procedure: LEFT HEART CATH AND CORONARY ANGIOGRAPHY;  Surgeon: Leonie Man, MD;  Location: [place] [facility];  Service: Cardiovascular;  Laterality: N/A;  ? ? ?No Known Allergies ? ?Current Outpatient Medications  ?Medication Sig Dispense Refill  ? aspirin EC 81 MG EC tablet Take 1 tablet (81 mg total) by mouth daily. 90 tablet 3  ? atorvastatin (LIPITOR) 40 MG tablet TAKE 1 TABLET BY MOUTH EVERY DAY IN THE EVENING 90 tablet 3  ?  clopidogrel (PLAVIX) 75 MG tablet TAKE 1 TABLET BY MOUTH EVERY DAY 90 tablet 3  ? ezetimibe (ZETIA) 10 MG tablet TAKE 1 TABLET BY MOUTH EVERY DAY 90 tablet 4  ? nitroGLYCERIN (NITROSTAT) 0.4 MG SL tablet Place 1 tablet (0.4 mg total) under the tongue every 5 (five) minutes x 3 doses as needed for chest pain. 25 tablet 3  ? FLUZONE HIGH-DOSE QUADRIVALENT 0.7 ML SUSY     ? PFIZER COVID-19 VAC BIVALENT injection     ? ?No current facility-administered medications for this visit.  ? ? ?  Social History  ? ?Socioeconomic History  ? Marital status: Married  ?  Spouse name: Not on file  ? Number of children: 2  ? Years of education: 59  ? Highest education level: Not on file  ?Occupational History  ? Occupation: Media planner- unemployed currently   ?Tobacco Use  ? Smoking status: Former  ?  Packs/day: 0.10  ?  Years: 50.00  ?  Pack years: 5.00  ?  Types: Cigarettes  ?  Quit date: 02/15/2007  ?  Years since quitting: 14.9  ? Smokeless tobacco: Never  ?Vaping Use  ? Vaping Use: Former  ?Substance and Sexual Activity  ? Alcohol use: Yes  ?  Alcohol/week: 0.0 standard drinks  ?  Comment: rare, 3-4 x a yr  ? Drug use: No  ? Sexual activity: Not on file  ?Other Topics Concern  ? Not on file  ?Social History Narrative  ? Married, one son and one daughter.  ? Educ: HS  ? OccupCatering manager  ? No current tob but is vaping to try to quit cigs.  ? 50 pack-yr hx.  Quit x 10 yrs, then restarted 2018.  ? No alcohol.  ?   ?   ? 2-3 cups of caffeine daily  ? ?Social Determinants of Health  ? ?Financial Resource Strain: Not on file  ?Food Insecurity: Not on file  ?Transportation Needs: Not on file  ?Physical Activity: Not on file  ?Stress: Not on file  ?Social Connections: Not on file  ?Intimate Partner Violence: Not on file  ? ?Socially he is married and has 2 children. ? ?Family History  ?Problem Relation Age of Onset  ? Heart attack Mother   ? Cancer Neg Hx   ? Diabetes Neg Hx   ? ?ROS ?General: Negative; No fevers, chills, or  night sweats;  ?HEENT: Negative; No changes in vision or hearing, sinus congestion, difficulty swallowing ?Pulmonary: Negative; No cough, wheezing, shortness of breath, hemoptysis ?Cardiovascular: See HPI ?GI: Neg

## 2022-01-27 NOTE — Patient Instructions (Signed)
Medication Instructions:  ?No changes ?*If you need a refill on your cardiac medications before your next appointment, please call your pharmacy* ? ? ?Lab Work: ?None ordered ?If you have labs (blood work) drawn today and your tests are completely normal, you will receive your results only by: ?MyChart Message (if you have MyChart) OR ?A paper copy in the mail ?If you have any lab test that is abnormal or we need to change your treatment, we will call you to review the results. ? ? ?Testing/Procedures: ?None ordered ? ? ?Follow-Up: ?At [facility], you and your health needs are our priority.  As part of our continuing mission to provide you with exceptional heart care, we have created designated Provider Care Teams.  These Care Teams include your primary Cardiologist (physician) and Advanced Practice Providers (APPs -  Physician Assistants and Nurse Practitioners) who all work together to provide you with the care you need, when you need it. ? ?We recommend signing up for the patient portal called "MyChart".  Sign up information is provided on this After Visit Summary.  MyChart is used to connect with patients for Virtual Visits (Telemedicine).  Patients are able to view lab/test results, encounter notes, upcoming appointments, etc.  Non-urgent messages can be sent to your provider as well.   ?To learn more about what you can do with MyChart, go to [email or web address].   ? ?Your next appointment:   ?12 month(s) ? ?The format for your next appointment:   ?In Person ? ?Provider:   ?Thomas Kelly, MD { ? ?

## 2022-02-10 ENCOUNTER — Ambulatory Visit (INDEPENDENT_AMBULATORY_CARE_PROVIDER_SITE_OTHER): Payer: Medicare Other | Admitting: Family Medicine

## 2022-02-10 ENCOUNTER — Encounter: Payer: Self-pay | Admitting: Family Medicine

## 2022-02-10 VITALS — BP 108/63 | HR 51 | Temp 97.6°F | Ht 69.0 in | Wt 167.8 lb

## 2022-02-10 DIAGNOSIS — E782 Mixed hyperlipidemia: Secondary | ICD-10-CM

## 2022-02-10 DIAGNOSIS — Z23 Encounter for immunization: Secondary | ICD-10-CM

## 2022-02-10 DIAGNOSIS — Z0000 Encounter for general adult medical examination without abnormal findings: Secondary | ICD-10-CM

## 2022-02-10 DIAGNOSIS — Z1211 Encounter for screening for malignant neoplasm of colon: Secondary | ICD-10-CM

## 2022-02-10 DIAGNOSIS — I2510 Atherosclerotic heart disease of native coronary artery without angina pectoris: Secondary | ICD-10-CM

## 2022-02-10 DIAGNOSIS — N182 Chronic kidney disease, stage 2 (mild): Secondary | ICD-10-CM | POA: Diagnosis not present

## 2022-02-10 LAB — CBC WITH DIFFERENTIAL/PLATELET
Basophils Absolute: 0.1 10*3/uL (ref 0.0–0.1)
Basophils Relative: 0.9 % (ref 0.0–3.0)
Eosinophils Absolute: 0.2 10*3/uL (ref 0.0–0.7)
Eosinophils Relative: 2.3 % (ref 0.0–5.0)
HCT: 44.2 % (ref 39.0–52.0)
Hemoglobin: 14.9 g/dL (ref 13.0–17.0)
Lymphocytes Relative: 26 % (ref 12.0–46.0)
Lymphs Abs: 1.8 10*3/uL (ref 0.7–4.0)
MCHC: 33.6 g/dL (ref 30.0–36.0)
MCV: 90.2 fl (ref 78.0–100.0)
Monocytes Absolute: 0.6 10*3/uL (ref 0.1–1.0)
Monocytes Relative: 9.3 % (ref 3.0–12.0)
Neutro Abs: 4.3 10*3/uL (ref 1.4–7.7)
Neutrophils Relative %: 61.5 % (ref 43.0–77.0)
Platelets: 161 10*3/uL (ref 150.0–400.0)
RBC: 4.9 Mil/uL (ref 4.22–5.81)
RDW: 13.9 % (ref 11.5–15.5)
WBC: 6.9 10*3/uL (ref 4.0–10.5)

## 2022-02-10 LAB — COMPREHENSIVE METABOLIC PANEL
ALT: 34 U/L (ref 0–53)
AST: 29 U/L (ref 0–37)
Albumin: 4.2 g/dL (ref 3.5–5.2)
Alkaline Phosphatase: 92 U/L (ref 39–117)
BUN: 16 mg/dL (ref 6–23)
CO2: 28 mEq/L (ref 19–32)
Calcium: 9.3 mg/dL (ref 8.4–10.5)
Chloride: 106 mEq/L (ref 96–112)
Creatinine, Ser: 1.23 mg/dL (ref 0.40–1.50)
GFR: 57.42 mL/min — ABNORMAL LOW (ref >60.00)
Glucose, Bld: 85 mg/dL (ref 70–99)
Potassium: 4.2 mEq/L (ref 3.5–5.1)
Sodium: 141 mEq/L (ref 135–145)
Total Bilirubin: 0.7 mg/dL (ref 0.2–1.2)
Total Protein: 6.6 g/dL (ref 6.0–8.3)

## 2022-02-10 LAB — LIPID PANEL
Cholesterol: 120 mg/dL (ref 0–200)
HDL: 41.6 mg/dL (ref >39.00)
LDL Cholesterol: 64 mg/dL (ref 0–99)
NonHDL: 78.26
Total CHOL/HDL Ratio: 3
Triglycerides: 70 mg/dL (ref 0.0–149.0)
VLDL: 14 mg/dL (ref 0.0–40.0)

## 2022-02-10 MED ORDER — TETANUS-DIPHTH-ACELL PERTUSSIS 5-2-15.5 LF-MCG/0.5 IM SUSP: 0.5000 mL | Freq: Once | INTRAMUSCULAR | 0 refills | Status: AC

## 2022-02-10 NOTE — Progress Notes (Signed)
Office Note ?02/10/2022 ? ?CC:  ?Chief Complaint  ?Patient presents with  ? Annual Exam  ?  Pt is fasting  ? ? ?HPI:  ?Patient is a 76 y.o. male who is here for annual health maintenance exam and f/u HLD and borderline stage II/III CRI. ?A/P as of last visit: ?"1) HLD: compliant with atorva 40mg  qd, no side effects. ?  ?2) CRI: avoids NSAIDs and works on hydrating well. ?  ?3) Health maintenance exam: ?Reviewed age and gender appropriate health maintenance issues (prudent diet, regular exercise, health risks of tobacco and excessive alcohol, use of seatbelts, fire alarms in home, use of sunscreen).  Also reviewed age and gender appropriate health screening as well as vaccine recommendations. ?Vaccines: All UTD, including covid 19+ booster.  Flu-->already UTD.  Shingrix rx printed for pt to take to pharmacy. ?Labs: fasting HP labs ordered--will forward results to Dr. Claiborne Billings, who pt will arrange f/u with soon.  ?Prostate ca screening: no further prostate ca screening decided upon via shared decision-making process. ?Colon ca screening: no hx of polyps.  NEG FIT 11/01/19.  Rpt approx 11/2020--ordered today. ? ?INTERIM HX: ?Patrick Price feels well.  He remains active and does his best to eat a healthy diet. ? ? ?Past Medical History:  ?Diagnosis Date  ? Ataxia 03/09/2015  ? Bell's palsy   ? left  ? CAD S/P BMS PCI-RCA 1999; 01/2019  ? a) 1999 - BMS PCI RCA (in [place]. Followed by Dr. Claiborne Billings); [id number]: nuclear stress test- low risk scan. no ischemia or infarct/scar is seen EF 77%. c) Mar 2020->2 Resolute Onyx DES stents to RCA, EF 55-60%, inf hypokin, elev LVEDP  ? CAP (community acquired pneumonia) 07/2018  ? Noted on noncontrast, low dose CT done for lung ca screening.  ? Chronic renal insufficiency, stage 3 (moderate) ([place]) 2019  ? GFR 50s  ? Colon cancer screening 2019  ? FIT test neg 03/21/18  ? Hyperlipidemia   ? Recommended pt restart atorva 40 mg qd 06/28/18 but he declined, stating he had intol to rosuva and atorva  (?cough? per pt).  ? Hypertension   ? pt adamantly denies this dx  ? Loss of touch sensation on examination 03/09/2015  ? Polyneuropathy 03/2015  ? NCS/EMG 2016: symmetric length-dependent moderately severe axonal sensorimotor polyneuropathy (no ulnar or median neuropathy, no radiculopathy).  ? STEMI (ST elevation myocardial infarction) ([place]) 01/2019  ? 2 Resolute Onyx DES stents to RCA: ASA + plavix, statin.  (HR too low for BB, plus pt refused ARB).  ? Tobacco abuse   ? Quit 03/2018.  (30+ pack-yr cigs).  ? ? ?Past Surgical History:  ?Procedure Laterality Date  ? CARDIOVASCULAR STRESS TEST  2016  ? Myocard perf imaging: normal (EF/LV fxn normal, no ischemia)  ? COLONOSCOPY  06/2006; 2014  ? 2014 NORMAL.  Recall 10 yrs [facility]).  FIT test NEG 03/21/18.  ? CORONARY ANGIOPLASTY WITH STENT PLACEMENT  1999  ? RCA stent 1999 [facility] [place])  ? CORONARY/GRAFT ACUTE MI REVASCULARIZATION N/A 01/23/2019  ? 2 Resolute Onyx DES stents to RCA. Procedure: Coronary/Graft Acute MI Revascularization;  Surgeon: Leonie Man, MD;  Location: [facility] [facility];  Service: Cardiovascular;  Laterality: N/A;  ? LEFT HEART CATH AND CORONARY ANGIOGRAPHY N/A 01/23/2019  ? 2 Resolute Onyx DES stents to RCA. Procedure: LEFT HEART CATH AND CORONARY ANGIOGRAPHY;  Surgeon: Leonie Man, MD;  Location: [facility];  Service: Cardiovascular;  Laterality: N/A;  ? ? ?Family History  ?Problem Relation  Age of Onset  ? Heart attack Mother   ? Cancer Neg Hx   ? Diabetes Neg Hx   ? ? ?Social History  ? ?Socioeconomic History  ? Marital status: Married  ?  Spouse name: Not on file  ? Number of children: 2  ? Years of education: 85  ? Highest education level: Not on file  ?Occupational History  ? Occupation: Media planner- unemployed currently   ?Tobacco Use  ? Smoking status: Former  ?  Packs/day: 0.10  ?  Years: 50.00  ?  Pack years: 5.00  ?  Types: Cigarettes  ?  Quit date: 02/15/2007  ?  Years since quitting: 14.9  ? Smokeless tobacco: Never   ?Vaping Use  ? Vaping Use: Former  ?Substance and Sexual Activity  ? Alcohol use: Yes  ?  Alcohol/week: 0.0 standard drinks  ?  Comment: rare, 3-4 x a yr  ? Drug use: No  ? Sexual activity: Not on file  ?Other Topics Concern  ? Not on file  ?Social History Narrative  ? Married, one son and one daughter.  ? Educ: HS  ? OccupCatering manager  ? No current tob but is vaping to try to quit cigs.  ? 50 pack-yr hx.  Quit x 10 yrs, then restarted 2018.  ? No alcohol.  ?   ?   ? 2-3 cups of caffeine daily  ? ?Social Determinants of Health  ? ?Financial Resource Strain: Not on file  ?Food Insecurity: Not on file  ?Transportation Needs: Not on file  ?Physical Activity: Not on file  ?Stress: Not on file  ?Social Connections: Not on file  ?Intimate Partner Violence: Not on file  ? ? ?Outpatient Medications Prior to Visit  ?Medication Sig Dispense Refill  ? aspirin EC 81 MG EC tablet Take 1 tablet (81 mg total) by mouth daily. 90 tablet 3  ? atorvastatin (LIPITOR) 40 MG tablet TAKE 1 TABLET BY MOUTH EVERY DAY IN THE EVENING 90 tablet 3  ? clopidogrel (PLAVIX) 75 MG tablet TAKE 1 TABLET BY MOUTH EVERY DAY 90 tablet 3  ? ezetimibe (ZETIA) 10 MG tablet TAKE 1 TABLET BY MOUTH EVERY DAY 90 tablet 4  ? FLUZONE HIGH-DOSE QUADRIVALENT 0.7 ML SUSY     ? nitroGLYCERIN (NITROSTAT) 0.4 MG SL tablet Place 1 tablet (0.4 mg total) under the tongue every 5 (five) minutes x 3 doses as needed for chest pain. 25 tablet 3  ? PFIZER COVID-19 VAC BIVALENT injection     ? ?No facility-administered medications prior to visit.  ? ? ?No Known Allergies ? ?ROS ?Review of Systems  ?Constitutional:  Negative for appetite change, chills, fatigue and fever.  ?HENT:  Negative for congestion, dental problem, ear pain and sore throat.   ?Eyes:  Negative for discharge, redness and visual disturbance.  ?Respiratory:  Negative for cough, chest tightness, shortness of breath and wheezing.   ?Cardiovascular:  Negative for chest pain, palpitations and leg  swelling.  ?Gastrointestinal:  Negative for abdominal pain, blood in stool, diarrhea, nausea and vomiting.  ?Genitourinary:  Negative for difficulty urinating, dysuria, flank pain, frequency, hematuria and urgency.  ?Musculoskeletal:  Negative for arthralgias, back pain, joint swelling, myalgias and neck stiffness.  ?Skin:  Negative for pallor and rash.  ?Neurological:  Negative for dizziness, speech difficulty, weakness and headaches.  ?Hematological:  Negative for adenopathy. Does not bruise/bleed easily.  ?Psychiatric/Behavioral:  Negative for confusion and sleep disturbance. The patient is not nervous/anxious.   ? ?PE; ? ?  02/10/2022  ?  8:29 AM 01/27/2022  ?  8:52 AM 11/28/2020  ? 10:28 AM  ?Vitals with BMI  ?Height 5\' 9"  5\' 8"  5\' 8"   ?Weight 167 lbs 13 oz 170 lbs 3 oz 172 lbs 10 oz  ?BMI 24.77 25.88 26.25  ?Systolic 123XX123 [id number]  ?Diastolic 63 56 72  ?Pulse 51 54 65  ? ? ?Gen: Alert, well appearing.  Patient is oriented to person, place, time, and situation. ?AFFECT: pleasant, lucid thought and speech. ?ENT: Ears: EACs clear, normal epithelium.  TMs with good light reflex and landmarks bilaterally.  Eyes: no injection, icteris, swelling, or exudate.  EOMI, PERRLA. ?Nose: no drainage or turbinate edema/swelling.  No injection or focal lesion.  Mouth: lips without lesion/swelling.  Oral mucosa pink and moist.  Dentition intact and without obvious caries or gingival swelling.  Oropharynx without erythema, exudate, or swelling.  ?Neck: supple/nontender.  No LAD, mass, or TM.  Carotid pulses 2+ bilaterally, without bruits. ?CV: RRR, no m/r/g.   ?LUNGS: CTA bilat, nonlabored resps, good aeration in all lung fields. ?ABD: soft, NT, ND, BS normal.  No hepatospenomegaly or mass.  No bruits. ?EXT: no clubbing, cyanosis, or edema.  ?Musculoskeletal: no joint swelling, erythema, warmth, or tenderness.  ROM of all joints intact. ?Skin - no sores or suspicious lesions or rashes or color changes ? ?Pertinent labs:  ?Lab Results   ?Component Value Date  ? TSH 1.24 08/21/2020  ? ?Lab Results  ?Component Value Date  ? WBC 7.1 08/21/2020  ? HGB 14.7 08/21/2020  ? HCT 43.8 08/21/2020  ? MCV 90.0 08/21/2020  ? PLT 184.0 08/21/2020  ? ?Lab Result

## 2022-02-10 NOTE — Patient Instructions (Signed)

## 2022-03-15 ENCOUNTER — Other Ambulatory Visit: Payer: Self-pay | Admitting: Cardiovascular Disease

## 2022-05-07 ENCOUNTER — Ambulatory Visit (INDEPENDENT_AMBULATORY_CARE_PROVIDER_SITE_OTHER): Payer: Medicare Other

## 2022-05-07 DIAGNOSIS — Z0000 Encounter for general adult medical examination without abnormal findings: Secondary | ICD-10-CM | POA: Diagnosis not present

## 2022-05-07 NOTE — Patient Instructions (Signed)
Mr. Mcisaac , Thank you for taking time to come for your Medicare Wellness Visit. I appreciate your ongoing commitment to your health goals. Please review the following plan we discussed and let me know if I can assist you in the future.   Screening recommendations/referrals: Colonoscopy: completed 11/01/19 pt stated he will send in  Recommended yearly ophthalmology/optometry visit for glaucoma screening and checkup Recommended yearly dental visit for hygiene and checkup  Vaccinations: Influenza vaccine: Done 08/12/21 repeat every year  Pneumococcal vaccine: Up to date Tdap vaccine: due and discussed  Shingles vaccine: Completed 08/21/20 & 12/31/20   Covid-19: Completed 2/21, 3/16, 07/31/20 & 08/12/21, 03/07/22  Advanced directives: Advance directive discussed with you today. Even though you declined this today please call our office should you change your mind and we can give you the proper paperwork for you to fill out.  Conditions/risks identified: none at this time   Next appointment: Follow up in one year for your annual wellness visit.   Preventive Care 4 Years and Older, Male Preventive care refers to lifestyle choices and visits with your health care provider that can promote health and wellness. What does preventive care include? A yearly physical exam. This is also called an annual well check. Dental exams once or twice a year. Routine eye exams. Ask your health care provider how often you should have your eyes checked. Personal lifestyle choices, including: Daily care of your teeth and gums. Regular physical activity. Eating a healthy diet. Avoiding tobacco and drug use. Limiting alcohol use. Practicing safe sex. Taking low doses of aspirin every day. Taking vitamin and mineral supplements as recommended by your health care provider. What happens during an annual well check? The services and screenings done by your health care provider during your annual well check will  depend on your age, overall health, lifestyle risk factors, and family history of disease. Counseling  Your health care provider may ask you questions about your: Alcohol use. Tobacco use. Drug use. Emotional well-being. Home and relationship well-being. Sexual activity. Eating habits. History of falls. Memory and ability to understand (cognition). Work and work Astronomer. Screening  You may have the following tests or measurements: Height, weight, and BMI. Blood pressure. Lipid and cholesterol levels. These may be checked every 5 years, or more frequently if you are over 78 years old. Skin check. Lung cancer screening. You may have this screening every year starting at age 67 if you have a 30-pack-year history of smoking and currently smoke or have quit within the past 15 years. Fecal occult blood test (FOBT) of the stool. You may have this test every year starting at age 64. Flexible sigmoidoscopy or colonoscopy. You may have a sigmoidoscopy every 5 years or a colonoscopy every 10 years starting at age 42. Prostate cancer screening. Recommendations will vary depending on your family history and other risks. Hepatitis C blood test. Hepatitis B blood test. Sexually transmitted disease (STD) testing. Diabetes screening. This is done by checking your blood sugar (glucose) after you have not eaten for a while (fasting). You may have this done every 1-3 years. Abdominal aortic aneurysm (AAA) screening. You may need this if you are a current or former smoker. Osteoporosis. You may be screened starting at age 55 if you are at high risk. Talk with your health care provider about your test results, treatment options, and if necessary, the need for more tests. Vaccines  Your health care provider may recommend certain vaccines, such as: Influenza vaccine. This is  recommended every year. Tetanus, diphtheria, and acellular pertussis (Tdap, Td) vaccine. You may need a Td booster every 10  years. Zoster vaccine. You may need this after age 20. Pneumococcal 13-valent conjugate (PCV13) vaccine. One dose is recommended after age 19. Pneumococcal polysaccharide (PPSV23) vaccine. One dose is recommended after age 39. Talk to your health care provider about which screenings and vaccines you need and how often you need them. This information is not intended to replace advice given to you by your health care provider. Make sure you discuss any questions you have with your health care provider. Document Released: 11/16/2015 Document Revised: 07/09/2016 Document Reviewed: 08/21/2015 Elsevier Interactive Patient Education  2017 [employer] Prevention in the Home Falls can cause injuries. They can happen to people of all ages. There are many things you can do to make your home safe and to help prevent falls. What can I do on the outside of my home? Regularly fix the edges of walkways and driveways and fix any cracks. Remove anything that might make you trip as you walk through a door, such as a raised step or threshold. Trim any bushes or trees on the path to your home. Use bright outdoor lighting. Clear any walking paths of anything that might make someone trip, such as rocks or tools. Regularly check to see if handrails are loose or broken. Make sure that both sides of any steps have handrails. Any raised decks and porches should have guardrails on the edges. Have any leaves, snow, or ice cleared regularly. Use sand or salt on walking paths during winter. Clean up any spills in your garage right away. This includes oil or grease spills. What can I do in the bathroom? Use night lights. Install grab bars by the toilet and in the tub and shower. Do not use towel bars as grab bars. Use non-skid mats or decals in the tub or shower. If you need to sit down in the shower, use a plastic, non-slip stool. Keep the floor dry. Clean up any water that spills on the floor as soon as it  happens. Remove soap buildup in the tub or shower regularly. Attach bath mats securely with double-sided non-slip rug tape. Do not have throw rugs and other things on the floor that can make you trip. What can I do in the bedroom? Use night lights. Make sure that you have a light by your bed that is easy to reach. Do not use any sheets or blankets that are too big for your bed. They should not hang down onto the floor. Have a firm chair that has side arms. You can use this for support while you get dressed. Do not have throw rugs and other things on the floor that can make you trip. What can I do in the kitchen? Clean up any spills right away. Avoid walking on wet floors. Keep items that you use a lot in easy-to-reach places. If you need to reach something above you, use a strong step stool that has a grab bar. Keep electrical cords out of the way. Do not use floor polish or wax that makes floors slippery. If you must use wax, use non-skid floor wax. Do not have throw rugs and other things on the floor that can make you trip. What can I do with my stairs? Do not leave any items on the stairs. Make sure that there are handrails on both sides of the stairs and use them. Fix handrails that are  broken or loose. Make sure that handrails are as long as the stairways. Check any carpeting to make sure that it is firmly attached to the stairs. Fix any carpet that is loose or worn. Avoid having throw rugs at the top or bottom of the stairs. If you do have throw rugs, attach them to the floor with carpet tape. Make sure that you have a light switch at the top of the stairs and the bottom of the stairs. If you do not have them, ask someone to add them for you. What else can I do to help prevent falls? Wear shoes that: Do not have high heels. Have rubber bottoms. Are comfortable and fit you well. Are closed at the toe. Do not wear sandals. If you use a stepladder: Make sure that it is fully opened.  Do not climb a closed stepladder. Make sure that both sides of the stepladder are locked into place. Ask someone to hold it for you, if possible. Clearly mark and make sure that you can see: Any grab bars or handrails. First and last steps. Where the edge of each step is. Use tools that help you move around (mobility aids) if they are needed. These include: Canes. Walkers. Scooters. Crutches. Turn on the lights when you go into a dark area. Replace any light bulbs as soon as they burn out. Set up your furniture so you have a clear path. Avoid moving your furniture around. If any of your floors are uneven, fix them. If there are any pets around you, be aware of where they are. Review your medicines with your doctor. Some medicines can make you feel dizzy. This can increase your chance of falling. Ask your doctor what other things that you can do to help prevent falls. This information is not intended to replace advice given to you by your health care provider. Make sure you discuss any questions you have with your health care provider. Document Released: 08/16/2009 Document Revised: 03/27/2016 Document Reviewed: 11/24/2014 Elsevier Interactive Patient Education  2017 [employer].

## 2022-05-07 NOTE — Progress Notes (Signed)
Virtual Visit via Telephone Note  I connected with  [name] on 05/07/22 at 10:00 AM EDT by telephone and verified that I am speaking with the correct person using two identifiers.  Medicare Annual Wellness visit completed telephonically due to Covid-19 pandemic.   Persons participating in this call: This Health Coach and this patient.   Location: Patient: Home Provider: Office    I discussed the limitations, risks, security and privacy concerns of performing an evaluation and management service by telephone and the availability of in person appointments. The patient expressed understanding and agreed to proceed.  Unable to perform video visit due to video visit attempted and failed and/or patient does not have video capability.   Some vital signs may be absent or patient reported.   Marzella Schlein, LPN   Subjective:   Martese Vanatta is a 76 y.o. male who presents for an Initial Medicare Annual Wellness Visit.  Review of Systems     Cardiac Risk Factors include: advanced age (>47men, >64 women);male gender;dyslipidemia     Objective:    There were no vitals filed for this visit. There is no height or weight on file to calculate BMI.     05/07/2022    9:52 AM 01/23/2019    8:00 PM 01/23/2019   11:04 AM 02/01/2015   11:58 AM  Advanced Directives  Does Patient Have a Medical Advance Directive? No No No No  Would patient like information on creating a medical advance directive? No - Patient declined No - Patient declined;Yes (Inpatient - patient defers creating a medical advance directive at this time) No - Patient declined No - patient declined information    Current Medications (verified) Outpatient Encounter Medications as of 05/07/2022  Medication Sig   aspirin EC 81 MG EC tablet Take 1 tablet (81 mg total) by mouth daily.   atorvastatin (LIPITOR) 40 MG tablet TAKE 1 TABLET BY MOUTH EVERY DAY IN THE EVENING   clopidogrel (PLAVIX) 75 MG tablet TAKE 1 TABLET BY  MOUTH EVERY DAY   ezetimibe (ZETIA) 10 MG tablet TAKE 1 TABLET BY MOUTH EVERY DAY   nitroGLYCERIN (NITROSTAT) 0.4 MG SL tablet Place 1 tablet (0.4 mg total) under the tongue every 5 (five) minutes x 3 doses as needed for chest pain.   FLUZONE HIGH-DOSE QUADRIVALENT 0.7 ML SUSY    PFIZER COVID-19 VAC BIVALENT injection    No facility-administered encounter medications on file as of 05/07/2022.    Allergies (verified) Patient has no known allergies.   History: Past Medical History:  Diagnosis Date   Ataxia 03/09/2015   Bell's palsy    left   CAD S/P BMS PCI-RCA 1999; 01/2019   a) 1999 - BMS PCI RCA (in [place]. Followed by Dr. Tresa Endo); [id number]: nuclear stress test- low risk scan. no ischemia or infarct/scar is seen EF 77%. c) Mar 2020->2 Resolute Onyx DES stents to RCA, EF 55-60%, inf hypokin, elev LVEDP   CAP (community acquired pneumonia) 07/2018   Noted on noncontrast, low dose CT done for lung ca screening.   Chronic renal insufficiency, stage 3 (moderate) (HCC) 2019   GFR 50s   Colon cancer screening 2019   FIT test neg 03/21/18   Hyperlipidemia    Recommended pt restart atorva 40 mg qd 06/28/18 but he declined, stating he had intol to rosuva and atorva (?cough? per pt).   Hypertension    pt adamantly denies this dx   Loss of touch sensation on examination 03/09/2015  Polyneuropathy 03/2015   NCS/EMG 2016: symmetric length-dependent moderately severe axonal sensorimotor polyneuropathy (no ulnar or median neuropathy, no radiculopathy).   STEMI (ST elevation myocardial infarction) ([place]) 01/2019   2 Resolute Onyx DES stents to RCA: ASA + plavix, statin.  (HR too low for BB, plus pt refused ARB).   Tobacco abuse    Quit 03/2018.  (30+ pack-yr cigs).   Past Surgical History:  Procedure Laterality Date   CARDIOVASCULAR STRESS TEST  2016   Myocard perf imaging: normal (EF/LV fxn normal, no ischemia)   COLONOSCOPY  06/2006; 2014   2014 NORMAL.  Recall 10 yrs [facility]).  FIT test NEG  03/21/18.   CORONARY ANGIOPLASTY WITH STENT PLACEMENT  1999   RCA stent 1999 Baldo Ash)   CORONARY/GRAFT ACUTE MI REVASCULARIZATION N/A 01/23/2019   2 Resolute Onyx DES stents to RCA. Procedure: Coronary/Graft Acute MI Revascularization;  Surgeon: Leonie Man, MD;  Location: [place] [facility];  Service: Cardiovascular;  Laterality: N/A;   LEFT HEART CATH AND CORONARY ANGIOGRAPHY N/A 01/23/2019   2 Resolute Onyx DES stents to RCA. Procedure: LEFT HEART CATH AND CORONARY ANGIOGRAPHY;  Surgeon: Leonie Man, MD;  Location: [place] [facility];  Service: Cardiovascular;  Laterality: N/A;   Family History  Problem Relation Age of Onset   Heart attack Mother    Cancer Neg Hx    Diabetes Neg Hx    Social History   Socioeconomic History   Marital status: Married    Spouse name: Not on file   Number of children: 2   Years of education: 12   Highest education level: Not on file  Occupational History   Occupation: Media planner- unemployed currently   Tobacco Use   Smoking status: Former    Packs/day: 0.10    Years: 50.00    Total pack years: 5.00    Types: Cigarettes    Quit date: 02/15/2007    Years since quitting: 15.2   Smokeless tobacco: Never  Vaping Use   Vaping Use: Former  Substance and Sexual Activity   Alcohol use: Yes    Alcohol/week: 0.0 standard drinks of alcohol    Comment: rare, 3-4 x a yr   Drug use: No   Sexual activity: Not on file  Other Topics Concern   Not on file  Social History Narrative   Married, one son and one daughter.   Educ: HS   OccupCatering manager   No current tob but is vaping to try to quit cigs.   50 pack-yr hx.  Quit x 10 yrs, then restarted 2018.   No alcohol.         2-3 cups of caffeine daily   Social Determinants of Health   Financial Resource Strain: Low Risk  (05/07/2022)   Overall Financial Resource Strain (CARDIA)    Difficulty of Paying Living Expenses: Not hard at all  Food Insecurity: No Food Insecurity  (05/07/2022)   Hunger Vital Sign    Worried About Running Out of Food in the Last Year: Never true    Ran Out of Food in the Last Year: Never true  Transportation Needs: No Transportation Needs (05/07/2022)   PRAPARE - Hydrologist (Medical): No    Lack of Transportation (Non-Medical): No  Physical Activity: Sufficiently Active (05/07/2022)   Exercise Vital Sign    Days of Exercise per Week: 7 days    Minutes of Exercise per Session: 60 min  Stress: No Stress Concern  Present (05/07/2022)   [employer] of Occupational Health - Occupational Stress Questionnaire    Feeling of Stress : Not at all  Social Connections: Moderately Integrated (05/07/2022)   Social Connection and Isolation Panel [NHANES]    Frequency of Communication with Friends and Family: More than three times a week    Frequency of Social Gatherings with Friends and Family: More than three times a week    Attends Religious Services: Never    Database administrator or Organizations: Yes    Attends Engineer, structural: 1 to 4 times per year    Marital Status: Married    Tobacco Counseling Counseling given: Not Answered   Clinical Intake:  Pre-visit preparation completed: Yes  Pain : No/denies pain     BMI - recorded: 24.78 Nutritional Status: BMI of 19-24  Normal Nutritional Risks: None Diabetes: No  How often do you need to have someone help you when you read instructions, pamphlets, or other written materials from your doctor or pharmacy?: 1 - Never  Diabetic?no  Interpreter Needed?: No  Information entered by :: Lanier Ensign, LPN   Activities of Daily Living    05/07/2022    9:53 AM  In your present state of health, do you have any difficulty performing the following activities:  Hearing? 0  Vision? 0  Difficulty concentrating or making decisions? 0  Walking or climbing stairs? 0  Dressing or bathing? 0  Doing errands, shopping? 0  Preparing Food and eating ?  N  Using the Toilet? N  In the past six months, have you accidently leaked urine? N  Do you have problems with loss of bowel control? N  Managing your Medications? N  Managing your Finances? N  Housekeeping or managing your Housekeeping? N    Patient Care Team: Jeoffrey Massed, MD as PCP - General (Family Medicine) Lennette Bihari, MD as PCP - Cardiology (Cardiology) Anson Fret, MD as Consulting Physician (Neurology)  Indicate any recent Medical Services you may have received from other than [facility] providers in the past year (date may be approximate).     Assessment:   This is a routine wellness examination for [place].  Hearing/Vision screen Hearing Screening - Comments:: Pt denies any hearing issues  Vision Screening - Comments:: Pt stated eye exams at work   Dietary issues and exercise activities discussed: Current Exercise Habits: Home exercise routine, Time (Minutes): 60, Frequency (Times/Week): 7, Weekly Exercise (Minutes/Week): 420   Goals Addressed             This Visit's Progress    Patient Stated       None at this time        Depression Screen    05/07/2022    9:49 AM 02/10/2022    8:33 AM 08/21/2020    9:56 AM 07/29/2019    3:05 PM 06/28/2018    8:23 AM 02/25/2015   10:20 AM  PHQ 2/9 Scores  PHQ - 2 Score 0 0 0 0 0 0    Fall Risk    05/07/2022    9:53 AM 02/10/2022    8:33 AM 08/21/2020    9:55 AM 07/29/2019    3:05 PM 06/28/2018    8:23 AM  Fall Risk   Falls in the past year? 0 0 0 0 No  Number falls in past yr: 0 0 0 0   Injury with Fall? 0 0 0 0   Follow up Falls prevention discussed Falls evaluation  completed Falls evaluation completed      FALL RISK PREVENTION PERTAINING TO THE HOME:  Any stairs in or around the home? Yes  If so, are there any without handrails? Yes  Home free of loose throw rugs in walkways, pet beds, electrical cords, etc? Yes  Adequate lighting in your home to reduce risk of falls? Yes   ASSISTIVE DEVICES UTILIZED  TO PREVENT FALLS:  Life alert? No  Use of a cane, walker or w/c? No  Grab bars in the bathroom? Yes  Shower chair or bench in shower? No  Elevated toilet seat or a handicapped toilet? No   TIMED UP AND GO:  Was the test performed? No .   Cognitive Function:        05/07/2022    9:54 AM  6CIT Screen  What Year? 0 points  What month? 0 points  What time? 0 points  Count back from 20 0 points  Months in reverse 0 points  Repeat phrase 0 points  Total Score 0 points    Immunizations Immunization History  Administered Date(s) Administered   Fluad Quad(high Dose 65+) 07/29/2019   Influenza, High Dose Seasonal PF 07/21/2018, 08/12/2021   Influenza-Unspecified 11/03/2017, 07/31/2020   PFIZER(Purple Top)SARS-COV-2 Vaccination 12/25/2019, 01/17/2020, 07/31/2020   Pfizer Covid-19 Vaccine Bivalent Booster 61yrs & up 08/12/2021, 03/07/2022   Pneumococcal Conjugate-13 02/26/2018   Pneumococcal Polysaccharide-23 07/29/2019   Tdap 11/03/2010   Zoster Recombinat (Shingrix) 08/21/2020, 12/31/2020    TDAP status: Due, Education has been provided regarding the importance of this vaccine. Advised may receive this vaccine at local pharmacy or Health Dept. Aware to provide a copy of the vaccination record if obtained from local pharmacy or Health Dept. Verbalized acceptance and understanding.  Flu Vaccine status: Up to date  Pneumococcal vaccine status: Up to date  Covid-19 vaccine status: Completed vaccines  Qualifies for Shingles Vaccine? Yes   Zostavax completed Yes   Shingrix Completed?: Yes  Screening Tests Health Maintenance  Topic Date Due   COLON CANCER SCREENING ANNUAL FOBT  10/31/2020   TETANUS/TDAP  11/03/2020   INFLUENZA VACCINE  06/03/2022   COLONOSCOPY (Pts 45-53yrs Insurance coverage will need to be confirmed)  11/03/2022   Pneumonia Vaccine 76+ Years old  Completed   COVID-19 Vaccine  Completed   Hepatitis C Screening  Completed   Zoster Vaccines- Shingrix   Completed   HPV VACCINES  Aged Out    Health Maintenance  Health Maintenance Due  Topic Date Due   COLON CANCER SCREENING ANNUAL FOBT  10/31/2020   TETANUS/TDAP  11/03/2020    Colorectal cancer screening: Type of screening: FOBT/FIT. Completed 11/01/19. Repeat every 1 years pt stated he will send in    Additional Screening:  Hepatitis C Screening:  Completed 03/28/15  Vision Screening: Recommended annual ophthalmology exams for early detection of glaucoma and other disorders of the eye. Is the patient up to date with their annual eye exam?  Yes  Who is the provider or what is the name of the office in which the patient attends annual eye exams? Stated at his place of employment  If pt is not established with a provider, would they like to be referred to a provider to establish care? No .   Dental Screening: Recommended annual dental exams for proper oral hygiene  Community Resource Referral / Chronic Care Management: CRR required this visit?  No   CCM required this visit?  No      Plan:  I have personally reviewed and noted the following in the patient's chart:   Medical and social history Use of alcohol, tobacco or illicit drugs  Current medications and supplements including opioid prescriptions. Patient is not currently taking opioid prescriptions. Functional ability and status Nutritional status Physical activity Advanced directives List of other physicians Hospitalizations, surgeries, and ER visits in previous 12 months Vitals Screenings to include cognitive, depression, and falls Referrals and appointments  In addition, I have reviewed and discussed with patient certain preventive protocols, quality metrics, and best practice recommendations. A written personalized care plan for preventive services as well as general preventive health recommendations were provided to patient.     Willette Brace, LPN   [id number]   Nurse Notes: None

## 2022-08-28 ENCOUNTER — Telehealth: Payer: Self-pay | Admitting: *Deleted

## 2022-08-28 NOTE — Telephone Encounter (Signed)
   Pre-operative Risk Assessment    Patient Name: Patrick Price  DOB: 1945-11-29 MRN: [id number]     Request for Surgical Clearance    Procedure:  Dental Extraction - Amount of Teeth to be Pulled:  NEED TO CONFIRM HOW MANY EXTRACTIONS  Date of Surgery:  Clearance TBD                                 Surgeon:  NOT LISTED Surgeon's Group or Practice Name:  [facility] Phone number:  [phone] Fax number:  [phone]   Type of Clearance Requested:   - Medical  - Pharmacy:  Hold Clopidogrel (Plavix)     Type of Anesthesia:  Not Indicated; NEED TO CONFIRM ANESTHESIA    Additional requests/questions:    Patrick Price   08/28/2022, 5:37 PM

## 2022-08-29 NOTE — Telephone Encounter (Signed)
   Patient Name: Patrick Price  DOB: February 18, 1946 MRN: [id number]  Primary Cardiologist: Shelva Majestic, MD  Chart reviewed as part of pre-operative protocol coverage.   Simple dental extractions (i.e. 1-2 teeth) are considered low risk procedures per guidelines and generally do not require any specific cardiac clearance. It is also generally accepted that for simple extractions and dental cleanings, there is no need to interrupt blood thinner therapy.  SBE prophylaxis is not required for the patient from a cardiac standpoint.  I will route this recommendation to the requesting party via Epic fax function and remove from pre-op pool.  Please call with questions.  Loel Dubonnet, NP 08/29/2022, 11:52 AM

## 2022-08-29 NOTE — Telephone Encounter (Signed)
Call placed to [facility],  spoke to Diane, she confirms pt is having only 1 extraction

## 2022-09-24 ENCOUNTER — Other Ambulatory Visit: Payer: Self-pay | Admitting: Cardiovascular Disease

## 2023-02-20 ENCOUNTER — Other Ambulatory Visit: Payer: Self-pay | Admitting: Cardiovascular Disease

## 2023-03-07 ENCOUNTER — Other Ambulatory Visit: Payer: Self-pay | Admitting: Cardiovascular Disease

## 2023-05-20 ENCOUNTER — Ambulatory Visit (INDEPENDENT_AMBULATORY_CARE_PROVIDER_SITE_OTHER): Payer: Medicare Other

## 2023-05-20 VITALS — Wt 167.0 lb

## 2023-05-20 DIAGNOSIS — Z0000 Encounter for general adult medical examination without abnormal findings: Secondary | ICD-10-CM

## 2023-05-20 NOTE — Progress Notes (Signed)
Subjective:   Patrick Price is a 77 y.o. male who presents for Medicare Annual/Subsequent preventive examination.  Per patient no change in vitals since last visit, unable to obtain new vitals due to telehealth visit   Visit Complete: Virtual  I connected with  Patrick Price on 05/20/23 by a audio enabled telemedicine application and verified that I am speaking with the correct person using two identifiers.  Patient Location: Home  Provider Location: Office/Clinic  I discussed the limitations of evaluation and management by telemedicine. The patient expressed understanding and agreed to proceed.  Patient Medicare AWV questionnaire was completed by the patient on 05/16/23; I have confirmed that all information answered by patient is correct and no changes since this date.  Review of Systems     Cardiac Risk Factors include: advanced age (>15men, >50 women);male gender;dyslipidemia     Objective:    Today's Vitals   05/20/23 0953  Weight: 167 lb (75.8 kg)   Body mass index is 24.66 kg/m.     05/20/2023    9:58 AM 05/07/2022    9:52 AM 01/23/2019    8:00 PM 01/23/2019   11:04 AM 02/01/2015   11:58 AM  Advanced Directives  Does Patient Have a Medical Advance Directive? Yes No No No No  Type of Estate agent of [place];Living will      Copy of Healthcare Power of Attorney in Chart? No - copy requested      Would patient like information on creating a medical advance directive?  No - Patient declined No - Patient declined;Yes (Inpatient - patient defers creating a medical advance directive at this time) No - Patient declined No - patient declined information    Current Medications (verified) Outpatient Encounter Medications as of 05/20/2023  Medication Sig   aspirin EC 81 MG EC tablet Take 1 tablet (81 mg total) by mouth daily.   atorvastatin (LIPITOR) 40 MG tablet TAKE 1 TABLET BY MOUTH EVERY DAY IN THE EVENING   clopidogrel (PLAVIX) 75 MG tablet  Take 1 tablet (75 mg total) by mouth daily. NEED OV.   ezetimibe (ZETIA) 10 MG tablet Take 1 tablet (10 mg total) by mouth daily. Patient needs appointment with provider.   nitroGLYCERIN (NITROSTAT) 0.4 MG SL tablet Place 1 tablet (0.4 mg total) under the tongue every 5 (five) minutes x 3 doses as needed for chest pain.   FLUZONE HIGH-DOSE QUADRIVALENT 0.7 ML SUSY    PFIZER COVID-19 VAC BIVALENT injection    No facility-administered encounter medications on file as of 05/20/2023.    Allergies (verified) Patient has no known allergies.   History: Past Medical History:  Diagnosis Date   Ataxia 03/09/2015   Bell's palsy    left   CAD S/P BMS PCI-RCA 1999; 01/2019   a) 1999 - BMS PCI RCA (in [place]. Followed by Dr. Tresa Endo); [id number]: nuclear stress test- low risk scan. no ischemia or infarct/scar is seen EF 77%. c) Mar 2020->2 Resolute Onyx DES stents to RCA, EF 55-60%, inf hypokin, elev LVEDP   CAP (community acquired pneumonia) 07/2018   Noted on noncontrast, low dose CT done for lung ca screening.   Chronic renal insufficiency, stage 3 (moderate) (HCC) 2019   GFR 50s   Colon cancer screening 2019   FIT test neg 03/21/18   Hyperlipidemia    Recommended pt restart atorva 40 mg qd 06/28/18 but he declined, stating he had intol to rosuva and atorva (?cough? per pt).   Hypertension  pt adamantly denies this dx   Loss of touch sensation on examination 03/09/2015   Polyneuropathy 03/2015   NCS/EMG 2016: symmetric length-dependent moderately severe axonal sensorimotor polyneuropathy (no ulnar or median neuropathy, no radiculopathy).   STEMI (ST elevation myocardial infarction) (HCC) 01/2019   2 Resolute Onyx DES stents to RCA: ASA + plavix, statin.  (HR too low for BB, plus pt refused ARB).   Tobacco abuse    Quit 03/2018.  (30+ pack-yr cigs).   Past Surgical History:  Procedure Laterality Date   CARDIOVASCULAR STRESS TEST  2016   Myocard perf imaging: normal (EF/LV fxn normal, no ischemia)    COLONOSCOPY  06/2006; 2014   2014 NORMAL.  Recall 10 yrs Deboraha Sprang).  FIT test NEG 03/21/18.   CORONARY ANGIOPLASTY WITH STENT PLACEMENT  1999   RCA stent 1999 Claris Gower)   CORONARY/GRAFT ACUTE MI REVASCULARIZATION N/A 01/23/2019   2 Resolute Onyx DES stents to RCA. Procedure: Coronary/Graft Acute MI Revascularization;  Surgeon: Marykay Lex, MD;  Location[facility] INVASIVE CV LAB;  Service: Cardiovascular;  Laterality: N/A;   LEFT HEART CATH AND CORONARY ANGIOGRAPHY N/A 01/23/2019   2 Resolute Onyx DES stents to RCA. Procedure: LEFT HEART CATH AND CORONARY ANGIOGRAPHY;  Surgeon: Marykay Lex, MD;  Location: [facility] INVASIVE CV LAB;  Service: Cardiovascular;  Laterality: N/A;   Family History  Problem Relation Age of Onset   Heart attack Mother    Cancer Neg Hx    Diabetes Neg Hx    Social History   Socioeconomic History   Marital status: Married    Spouse name: Not on file   Number of children: 2   Years of education: 12   Highest education level: Not on file  Occupational History   Occupation: Research officer, trade union- unemployed currently   Tobacco Use   Smoking status: Former    Current packs/day: 0.00    Average packs/day: 0.1 packs/day for 50.0 years (5.0 ttl pk-yrs)    Types: Cigarettes    Start date: 02/14/1957    Quit date: 02/15/2007    Years since quitting: 16.2   Smokeless tobacco: Never  Vaping Use   Vaping status: Former  Substance and Sexual Activity   Alcohol use: Yes    Alcohol/week: 0.0 standard drinks of alcohol    Comment: rare, 3-4 x a yr   Drug use: No   Sexual activity: Not on file  Other Topics Concern   Not on file  Social History Narrative   Married, one son and one daughter.   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   50 pack-yr hx.  Quit x 10 yrs, then restarted 2018.   No alcohol.         2-3 cups of caffeine daily   Social Determinants of Health   Financial Resource Strain: Low Risk   (05/16/2023)   Overall Financial Resource Strain (CARDIA)    Difficulty of Paying Living Expenses: Not hard at all  Food Insecurity: No Food Insecurity (05/16/2023)   Hunger Vital Sign    Worried About Running Out of Food in the Last Year: Never true    Ran Out of Food in the Last Year: Never true  Transportation Needs: No Transportation Needs (05/16/2023)   PRAPARE - Administrator, Civil Service (Medical): No    Lack of Transportation (Non-Medical): No  Physical Activity: Sufficiently Active (05/16/2023)   Exercise Vital Sign  Days of Exercise per Week: 5 days    Minutes of Exercise per Session: 60 min  Stress: No Stress Concern Present (05/16/2023)   [employer] of Occupational Health - Occupational Stress Questionnaire    Feeling of Stress : Not at all  Social Connections: Moderately Isolated (05/16/2023)   Social Connection and Isolation Panel [NHANES]    Frequency of Communication with Friends and Family: More than three times a week    Frequency of Social Gatherings with Friends and Family: More than three times a week    Attends Religious Services: Never    Database administrator or Organizations: No    Attends Engineer, structural: Never    Marital Status: Married    Tobacco Counseling Counseling given: Not Answered   Clinical Intake:  Pre-visit preparation completed: Yes  Pain : No/denies pain     Nutritional Risks: None Diabetes: No  How often do you need to have someone help you when you read instructions, pamphlets, or other written materials from your doctor or pharmacy?: 1 - Never  Interpreter Needed?: No  Information entered by :: Lanier Ensign, LPN   Activities of Daily Living    05/16/2023    6:02 PM  In your present state of health, do you have any difficulty performing the following activities:  Hearing? 0  Vision? 0  Difficulty concentrating or making decisions? 0  Walking or climbing stairs? 0  Dressing or bathing?  0  Doing errands, shopping? 0  Preparing Food and eating ? N  Using the Toilet? N  In the past six months, have you accidently leaked urine? N  Do you have problems with loss of bowel control? N  Managing your Medications? N  Managing your Finances? N  Housekeeping or managing your Housekeeping? N    Patient Care Team: Jeoffrey Massed, MD as PCP - General (Family Medicine) Lennette Bihari, MD as PCP - Cardiology (Cardiology) Anson Fret, MD as Consulting Physician (Neurology)  Indicate any recent Medical Services you may have received from other than [facility] providers in the past year (date may be approximate).     Assessment:   This is a routine wellness examination for Saim.  Hearing/Vision screen Hearing Screening - Comments:: Pt denies any hearing issues Vision Screening - Comments:: Pt encouraged to follow up with provider   Dietary issues and exercise activities discussed:     Goals Addressed             This Visit's Progress    Patient Stated       None at this time        Depression Screen    05/20/2023    9:57 AM 05/07/2022    9:49 AM 02/10/2022    8:33 AM 08/21/2020    9:56 AM 07/29/2019    3:05 PM 06/28/2018    8:23 AM 02/25/2015   10:20 AM  PHQ 2/9 Scores  PHQ - 2 Score 0 0 0 0 0 0 0    Fall Risk    05/16/2023    6:02 PM 05/07/2022    9:53 AM 02/10/2022    8:33 AM 08/21/2020    9:55 AM 07/29/2019    3:05 PM  Fall Risk   Falls in the past year? 0 0 0 0 0  Number falls in past yr: 0 0 0 0 0  Injury with Fall? 0 0 0 0 0  Risk for fall due to : Impaired vision  Follow up Falls prevention discussed Falls prevention discussed Falls evaluation completed Falls evaluation completed     MEDICARE RISK AT HOME:   TIMED UP AND GO:  Was the test performed?  No    Cognitive Function:        05/20/2023    9:59 AM 05/07/2022    9:54 AM  6CIT Screen  What Year? 0 points 0 points  What month? 0 points 0 points  What time? 0 points 0 points   Count back from 20 0 points 0 points  Months in reverse 0 points 0 points  Repeat phrase 0 points 0 points  Total Score 0 points 0 points    Immunizations Immunization History  Administered Date(s) Administered   Fluad Quad(high Dose 65+) 07/29/2019   Influenza, High Dose Seasonal PF 07/21/2018, 08/12/2021   Influenza-Unspecified 11/03/2017, 07/31/2020, 08/01/2022   PFIZER(Purple Top)SARS-COV-2 Vaccination 12/25/2019, 01/17/2020, 07/31/2020   Pfizer Covid-19 Vaccine Bivalent Booster 25yrs & up 08/12/2021, 03/07/2022   Pneumococcal Conjugate-13 02/26/2018   Pneumococcal Polysaccharide-23 07/29/2019   Tdap 11/03/2010   Unspecified SARS-COV-2 Vaccination 08/01/2022   Zoster Recombinant(Shingrix) 08/21/2020, 12/31/2020    TDAP status: Due, Education has been provided regarding the importance of this vaccine. Advised may receive this vaccine at local pharmacy or Health Dept. Aware to provide a copy of the vaccination record if obtained from local pharmacy or Health Dept. Verbalized acceptance and understanding.  Flu Vaccine status: Due, Education has been provided regarding the importance of this vaccine. Advised may receive this vaccine at local pharmacy or Health Dept. Aware to provide a copy of the vaccination record if obtained from local pharmacy or Health Dept. Verbalized acceptance and understanding.  Pneumococcal vaccine status: Up to date  Covid-19 vaccine status: Completed vaccines  Qualifies for Shingles Vaccine? Yes   Zostavax completed Yes   Shingrix Completed?: Yes  Screening Tests Health Maintenance  Topic Date Due   DTaP/Tdap/Td (2 - Td or Tdap) 11/03/2020   COVID-19 Vaccine (7 - 2023-24 season) 09/26/2022   INFLUENZA VACCINE  06/04/2023   Medicare Annual Wellness (AWV)  05/19/2024   Pneumonia Vaccine 7+ Years old  Completed   Hepatitis C Screening  Completed   Zoster Vaccines- Shingrix  Completed   HPV VACCINES  Aged Out   Colonoscopy  Discontinued     Health Maintenance  Health Maintenance Due  Topic Date Due   DTaP/Tdap/Td (2 - Td or Tdap) 11/03/2020   COVID-19 Vaccine (7 - 2023-24 season) 09/26/2022    Colorectal cancer screening: No longer required.    Additional Screening:  Hepatitis C Screening:  Completed 03/28/15  Vision Screening: Recommended annual ophthalmology exams for early detection of glaucoma and other disorders of the eye. Is the patient up to date with their annual eye exam?  Yes  Who is the provider or what is the name of the office in which the patient attends annual eye exams? Per work  If pt is not established with a provider, would they like to be referred to a provider to establish care? No .   Dental Screening: Recommended annual dental exams for proper oral hygiene    Community Resource Referral / Chronic Care Management: CRR required this visit?  No   CCM required this visit?  No     Plan:     I have personally reviewed and noted the following in the patient's chart:   Medical and social history Use of alcohol, tobacco or illicit drugs  Current medications and supplements including opioid  prescriptions. Patient is not currently taking opioid prescriptions. Functional ability and status Nutritional status Physical activity Advanced directives List of other physicians Hospitalizations, surgeries, and ER visits in previous 12 months Vitals Screenings to include cognitive, depression, and falls Referrals and appointments  In addition, I have reviewed and discussed with patient certain preventive protocols, quality metrics, and best practice recommendations. A written personalized care plan for preventive services as well as general preventive health recommendations were provided to patient.     Marzella Schlein, LPN   [id number]/82/9562   After Visit Summary: (MyChart) Due to this being a telephonic visit, the after visit summary with patients personalized plan was offered to patient via  MyChart   Nurse Notes: none

## 2023-05-20 NOTE — Patient Instructions (Signed)
Patrick Price , Thank you for taking time to come for your Medicare Wellness Visit. I appreciate your ongoing commitment to your health goals. Please review the following plan we discussed and let me know if I can assist you in the future.   These are the goals we discussed:  Goals      Patient Stated     None at this time      Patient Stated     None at this time         This is a list of the screening recommended for you and due dates:  Health Maintenance  Topic Date Due   DTaP/Tdap/Td vaccine (2 - Td or Tdap) 11/03/2020   COVID-19 Vaccine (6 - 2023-24 season) 07/04/2022   Flu Shot  06/04/2023   Medicare Annual Wellness Visit  05/19/2024   Pneumonia Vaccine  Completed   Hepatitis C Screening  Completed   Zoster (Shingles) Vaccine  Completed   HPV Vaccine  Aged Out   Colon Cancer Screening  Discontinued    Advanced directives: Please bring a copy of your health care power of attorney and living will to the office at your convenience.  Conditions/risks identified: none at this time   Next appointment: Follow up in one year for your annual wellness visit.   Preventive Care 77 Years and Older, Male  Preventive care refers to lifestyle choices and visits with your health care provider that can promote health and wellness. What does preventive care include? A yearly physical exam. This is also called an annual well check. Dental exams once or twice a year. Routine eye exams. Ask your health care provider how often you should have your eyes checked. Personal lifestyle choices, including: Daily care of your teeth and gums. Regular physical activity. Eating a healthy diet. Avoiding tobacco and drug use. Limiting alcohol use. Practicing safe sex. Taking low doses of aspirin every day. Taking vitamin and mineral supplements as recommended by your health care provider. What happens during an annual well check? The services and screenings done by your health care provider during  your annual well check will depend on your age, overall health, lifestyle risk factors, and family history of disease. Counseling  Your health care provider may ask you questions about your: Alcohol use. Tobacco use. Drug use. Emotional well-being. Home and relationship well-being. Sexual activity. Eating habits. History of falls. Memory and ability to understand (cognition). Work and work Astronomer. Screening  You may have the following tests or measurements: Height, weight, and BMI. Blood pressure. Lipid and cholesterol levels. These may be checked every 5 years, or more frequently if you are over 43 years old. Skin check. Lung cancer screening. You may have this screening every year starting at age 55 if you have a 30-pack-year history of smoking and currently smoke or have quit within the past 15 years. Fecal occult blood test (FOBT) of the stool. You may have this test every year starting at age 49. Flexible sigmoidoscopy or colonoscopy. You may have a sigmoidoscopy every 5 years or a colonoscopy every 10 years starting at age 57. Prostate cancer screening. Recommendations will vary depending on your family history and other risks. Hepatitis C blood test. Hepatitis B blood test. Sexually transmitted disease (STD) testing. Diabetes screening. This is done by checking your blood sugar (glucose) after you have not eaten for a while (fasting). You may have this done every 1-3 years. Abdominal aortic aneurysm (AAA) screening. You may need this if you are  a current or former smoker. Osteoporosis. You may be screened starting at age 16 if you are at high risk. Talk with your health care provider about your test results, treatment options, and if necessary, the need for more tests. Vaccines  Your health care provider may recommend certain vaccines, such as: Influenza vaccine. This is recommended every year. Tetanus, diphtheria, and acellular pertussis (Tdap, Td) vaccine. You may need  a Td booster every 10 years. Zoster vaccine. You may need this after age 57. Pneumococcal 13-valent conjugate (PCV13) vaccine. One dose is recommended after age 17. Pneumococcal polysaccharide (PPSV23) vaccine. One dose is recommended after age 39. Talk to your health care provider about which screenings and vaccines you need and how often you need them. This information is not intended to replace advice given to you by your health care provider. Make sure you discuss any questions you have with your health care provider. Document Released: 11/16/2015 Document Revised: 07/09/2016 Document Reviewed: 08/21/2015 Elsevier Interactive Patient Education  2017 [employer].  Fall Prevention in the Home Falls can cause injuries. They can happen to people of all ages. There are many things you can do to make your home safe and to help prevent falls. What can I do on the outside of my home? Regularly fix the edges of walkways and driveways and fix any cracks. Remove anything that might make you trip as you walk through a door, such as a raised step or threshold. Trim any bushes or trees on the path to your home. Use bright outdoor lighting. Clear any walking paths of anything that might make someone trip, such as rocks or tools. Regularly check to see if handrails are loose or broken. Make sure that both sides of any steps have handrails. Any raised decks and porches should have guardrails on the edges. Have any leaves, snow, or ice cleared regularly. Use sand or salt on walking paths during winter. Clean up any spills in your garage right away. This includes oil or grease spills. What can I do in the bathroom? Use night lights. Install grab bars by the toilet and in the tub and shower. Do not use towel bars as grab bars. Use non-skid mats or decals in the tub or shower. If you need to sit down in the shower, use a plastic, non-slip stool. Keep the floor dry. Clean up any water that spills on the  floor as soon as it happens. Remove soap buildup in the tub or shower regularly. Attach bath mats securely with double-sided non-slip rug tape. Do not have throw rugs and other things on the floor that can make you trip. What can I do in the bedroom? Use night lights. Make sure that you have a light by your bed that is easy to reach. Do not use any sheets or blankets that are too big for your bed. They should not hang down onto the floor. Have a firm chair that has side arms. You can use this for support while you get dressed. Do not have throw rugs and other things on the floor that can make you trip. What can I do in the kitchen? Clean up any spills right away. Avoid walking on wet floors. Keep items that you use a lot in easy-to-reach places. If you need to reach something above you, use a strong step stool that has a grab bar. Keep electrical cords out of the way. Do not use floor polish or wax that makes floors slippery. If you must  use wax, use non-skid floor wax. Do not have throw rugs and other things on the floor that can make you trip. What can I do with my stairs? Do not leave any items on the stairs. Make sure that there are handrails on both sides of the stairs and use them. Fix handrails that are broken or loose. Make sure that handrails are as long as the stairways. Check any carpeting to make sure that it is firmly attached to the stairs. Fix any carpet that is loose or worn. Avoid having throw rugs at the top or bottom of the stairs. If you do have throw rugs, attach them to the floor with carpet tape. Make sure that you have a light switch at the top of the stairs and the bottom of the stairs. If you do not have them, ask someone to add them for you. What else can I do to help prevent falls? Wear shoes that: Do not have high heels. Have rubber bottoms. Are comfortable and fit you well. Are closed at the toe. Do not wear sandals. If you use a stepladder: Make sure that  it is fully opened. Do not climb a closed stepladder. Make sure that both sides of the stepladder are locked into place. Ask someone to hold it for you, if possible. Clearly mark and make sure that you can see: Any grab bars or handrails. First and last steps. Where the edge of each step is. Use tools that help you move around (mobility aids) if they are needed. These include: Canes. Walkers. Scooters. Crutches. Turn on the lights when you go into a dark area. Replace any light bulbs as soon as they burn out. Set up your furniture so you have a clear path. Avoid moving your furniture around. If any of your floors are uneven, fix them. If there are any pets around you, be aware of where they are. Review your medicines with your doctor. Some medicines can make you feel dizzy. This can increase your chance of falling. Ask your doctor what other things that you can do to help prevent falls. This information is not intended to replace advice given to you by your health care provider. Make sure you discuss any questions you have with your health care provider. Document Released: 08/16/2009 Document Revised: 03/27/2016 Document Reviewed: 11/24/2014 Elsevier Interactive Patient Education  2017 [employer].

## 2023-08-18 ENCOUNTER — Other Ambulatory Visit: Payer: Self-pay | Admitting: Cardiovascular Disease

## 2023-09-27 ENCOUNTER — Other Ambulatory Visit: Payer: Self-pay | Admitting: Cardiovascular Disease

## 2023-10-21 ENCOUNTER — Other Ambulatory Visit: Payer: Self-pay | Admitting: Cardiovascular Disease

## 2023-11-03 ENCOUNTER — Other Ambulatory Visit: Payer: Self-pay | Admitting: Cardiovascular Disease

## 2023-11-03 NOTE — Progress Notes (Signed)
 Cardiology Office Note:  .   Date:  11/06/2023  ID:  Patrick Price, DOB 08-Mar-1946, MRN [id number] PCP: Patrick Price DEL, MD  [facility] Providers Cardiologist: Patrick Sor, MD   }   History of Present Illness: .   Patrick Price is a 77 y.o. male with history of coronary artery disease, status post PCI to the RCA 1999, PCI to the LAD just proximal to the prior stent with stent overlap to the previously placed bare-metal stent and stenting of a 75% LAD stenosis. He had concomitant CAD with 40% mid LAD stenosis and 30% mid RCA stenoses.  in March 2020 in the setting of non-STEMI, hyperlipidemia.  He has been recommended for long-term DAPT.  He is doing very well today.  He works 2 jobs with fish farm manager, walks his dog anywhere from 3 to 5 miles daily and puts in close to 20-30,000 steps a day without any cardiac issues.  No chest pain shortness of breath dizziness or fatigue.  He is not having any bleeding issues on Plavix  although he does have occasional bruising.  He is medically compliant.  ROS: As above otherwise negative  Studies Reviewed: Patrick Price   EKG Interpretation Date/Time:  Friday November 06 2023 15:40:39 EST Ventricular Rate:  61 PR Interval:  186 QRS Duration:  84 QT Interval:  384 QTC Calculation: 386 R Axis:   33  Text Interpretation: Normal sinus rhythm Normal ECG When compared with ECG of 24-Jan-2019 07:30, T wave inversion no longer evident in Inferior leads Confirmed by Patrick Price [phone]) on 11/06/2023 5:02:49 PM    LHC 01/23/2019 Culprit Lesion #1: Prox RCA-1 lesion is 99% stenosed just proximal to prior stent. Prox RCA-2 previously placed bare-metal stent (BMS) is 30% stenosed. A drug-eluting stent was successfully placed covering the lesion and the prior BMS stent using a STENT RESOLUTE ONYX 2.5X18. Postdilated 2.85 mm Post intervention, there is a 0% residual stenosis. Lesion #2: Dist RCA lesion is 75% stenosed. A drug-eluting stent was  successfully placed using a STENT RESOLUTE ONYX 2.5X15. Postdilated to 2.8 mm Post intervention, there is a 0% residual stenosis. Prox RCA to Mid RCA lesion is 30% stenosed. Distal to the new stent ------------------------------------- Prox LAD lesion is 40% stenosed. Between 1st & 2nd Diag branches The Left Ventricular Systolic Function is normal, with an estimated Left Ventricular EF is 50-55% by visual estimate. LV end diastolic pressure is mildly elevated. A drug-eluting stent was successfully placed using a STENT RESOLUTE ONYX 2.5X15. A drug-eluting stent was successfully placed using a STENT RESOLUTE ONYX 2.5X18.   SUMMARY Severe single-vessel disease with 2 lesions in the proximal and distal RCA treated with 2 Resolute Onyx DES stents (2.5 mm x 15 mm distal, 2.5 mm x 18 mm proximal covering prior stent -post postdilated to 2.8 mm) Minimal disease elsewhere. Preserved LVEF of 50 to 55% with inferior hypokinesis and moderately elevated LVEDP.     Physical Exam:   VS:  BP 120/62 (BP Location: Left Arm, Patient Position: Sitting)   Pulse 61   Wt 156 lb 9.6 oz (71 kg)   SpO2 96%   BMI 23.13 kg/m    Wt Readings from Last 3 Encounters:  11/06/23 156 lb 9.6 oz (71 kg)  05/20/23 167 lb (75.8 kg)  02/10/22 167 lb 12.8 oz (76.1 kg)    GEN: Well nourished, well developed in no acute distress NECK: No JVD; No carotid bruits CARDIAC: RRR, no murmurs, rubs, gallops RESPIRATORY:  Clear  to auscultation without rales, wheezing or rhonchi  ABDOMEN: Soft, non-tender, non-distended EXTREMITIES:  No edema; No deformity   ASSESSMENT AND PLAN: .    Coronary artery disease: History of PCI to the RCA 1998, along with PCI to the LAD overlapping the previous bare-metal stent with concomitant CAD with 40% mLAD stenosis 30% mid right coronary artery stenosis in March 2020.  This occurred in the setting of an NSTEMI.  He is completely asymptomatic and extremely physically active.  Continue current  medication management and secondary prevention as he is doing.  As he is on DAPT will order CBC to evaluate for any evidence of anemia.  2.  Hypercholesterolemia: He has not had any lipid panel labs for 2 years.  Will order fasting lipids as well as a CMET.  He knows to return to our office in order to do this fasting.           Signed, Patrick Price. Patrick CHOL, ANP, AACC

## 2023-11-06 ENCOUNTER — Ambulatory Visit: Payer: Medicare Other | Attending: Adult Health | Admitting: Adult Health

## 2023-11-06 ENCOUNTER — Encounter: Payer: Self-pay | Admitting: Adult Health

## 2023-11-06 VITALS — BP 120/62 | HR 61 | Wt 156.6 lb

## 2023-11-06 DIAGNOSIS — Z79899 Other long term (current) drug therapy: Secondary | ICD-10-CM

## 2023-11-06 DIAGNOSIS — E7800 Pure hypercholesterolemia, unspecified: Secondary | ICD-10-CM | POA: Diagnosis not present

## 2023-11-06 DIAGNOSIS — Z9861 Coronary angioplasty status: Secondary | ICD-10-CM | POA: Diagnosis not present

## 2023-11-06 DIAGNOSIS — I2510 Atherosclerotic heart disease of native coronary artery without angina pectoris: Secondary | ICD-10-CM

## 2023-11-06 NOTE — Patient Instructions (Addendum)
 Medication Instructions:  NO CHANGES    Lab Work: LIPID PANEL, CBC, AND CMET TO BE DONE WHEN FASTING.    Testing/Procedures: NONE   Follow-Up: At [facility], you and your health needs are our priority.  As part of our continuing mission to provide you with exceptional heart care, we have created designated Provider Care Teams.  These Care Teams include your primary Cardiologist (physician) and Advanced Practice Providers (APPs -  Physician Assistants and Nurse Practitioners) who all work together to provide you with the care you need, when you need it.    Your next appointment:   1 YEAR  Provider:   Debby Sor, MD

## 2023-11-14 ENCOUNTER — Other Ambulatory Visit: Payer: Self-pay | Admitting: Cardiovascular Disease

## 2023-11-19 DIAGNOSIS — I2510 Atherosclerotic heart disease of native coronary artery without angina pectoris: Secondary | ICD-10-CM | POA: Diagnosis not present

## 2023-11-19 DIAGNOSIS — Z79899 Other long term (current) drug therapy: Secondary | ICD-10-CM | POA: Diagnosis not present

## 2023-11-19 DIAGNOSIS — Z9861 Coronary angioplasty status: Secondary | ICD-10-CM | POA: Diagnosis not present

## 2023-11-20 ENCOUNTER — Telehealth: Payer: Self-pay

## 2023-11-20 LAB — BASIC METABOLIC PANEL
BUN/Creatinine Ratio: 16 (ref 10–24)
BUN: 22 mg/dL (ref 8–27)
CO2: 25 mmol/L (ref 20–29)
Calcium: 9.5 mg/dL (ref 8.6–10.2)
Chloride: 107 mmol/L — ABNORMAL HIGH (ref 96–106)
Creatinine, Ser: 1.38 mg/dL — ABNORMAL HIGH (ref 0.76–1.27)
Glucose: 89 mg/dL (ref 70–99)
Potassium: 4.8 mmol/L (ref 3.5–5.2)
Sodium: 143 mmol/L (ref 134–144)
eGFR: 53 mL/min/{1.73_m2} — ABNORMAL LOW (ref >59)

## 2023-11-20 LAB — CBC
Hematocrit: 42.6 % (ref 37.5–51.0)
Hemoglobin: 14.5 g/dL (ref 13.0–17.7)
MCH: 31.1 pg (ref 26.6–33.0)
MCHC: 34 g/dL (ref 31.5–35.7)
MCV: 91 fL (ref 79–97)
Platelets: 155 10*3/uL (ref 150–450)
RBC: 4.66 x10E6/uL (ref 4.14–5.80)
RDW: 12.6 % (ref 11.6–15.4)
WBC: 7.9 10*3/uL (ref 3.4–10.8)

## 2023-11-20 LAB — LIPID PANEL
Chol/HDL Ratio: 2.4 {ratio} (ref 0.0–5.0)
Cholesterol, Total: 110 mg/dL (ref 100–199)
HDL: 45 mg/dL (ref >39)
LDL Chol Calc (NIH): 51 mg/dL (ref 0–99)
Triglycerides: 63 mg/dL (ref 0–149)
VLDL Cholesterol Cal: 14 mg/dL (ref 5–40)

## 2023-11-20 NOTE — Telephone Encounter (Addendum)
Results reviewed by patient via MyChart.----- Message from Joni Reining sent at 11/20/2023  7:37 AM EST ----- I have reviewed the labs, cholesterol status is normal, he is not anemic, kidney function is slightly strained.  Should increase fluids.  No changes in medicine.

## 2024-01-01 ENCOUNTER — Other Ambulatory Visit: Payer: Self-pay | Admitting: Cardiovascular Disease

## 2024-01-26 DIAGNOSIS — H401133 Primary open-angle glaucoma, bilateral, severe stage: Secondary | ICD-10-CM | POA: Diagnosis not present

## 2024-01-27 DIAGNOSIS — H40013 Open angle with borderline findings, low risk, bilateral: Secondary | ICD-10-CM | POA: Diagnosis not present

## 2024-01-27 DIAGNOSIS — H401133 Primary open-angle glaucoma, bilateral, severe stage: Secondary | ICD-10-CM | POA: Diagnosis not present

## 2024-01-27 DIAGNOSIS — H2513 Age-related nuclear cataract, bilateral: Secondary | ICD-10-CM | POA: Diagnosis not present

## 2024-03-25 DIAGNOSIS — H2513 Age-related nuclear cataract, bilateral: Secondary | ICD-10-CM | POA: Diagnosis not present

## 2024-03-25 DIAGNOSIS — H401133 Primary open-angle glaucoma, bilateral, severe stage: Secondary | ICD-10-CM | POA: Diagnosis not present

## 2024-04-08 ENCOUNTER — Telehealth: Payer: Self-pay | Admitting: *Deleted

## 2024-04-08 DIAGNOSIS — H25812 Combined forms of age-related cataract, left eye: Secondary | ICD-10-CM | POA: Diagnosis not present

## 2024-04-08 DIAGNOSIS — H401123 Primary open-angle glaucoma, left eye, severe stage: Secondary | ICD-10-CM | POA: Diagnosis not present

## 2024-04-08 DIAGNOSIS — Z01818 Encounter for other preprocedural examination: Secondary | ICD-10-CM | POA: Diagnosis not present

## 2024-04-08 DIAGNOSIS — H25811 Combined forms of age-related cataract, right eye: Secondary | ICD-10-CM | POA: Diagnosis not present

## 2024-04-08 DIAGNOSIS — H401113 Primary open-angle glaucoma, right eye, severe stage: Secondary | ICD-10-CM | POA: Diagnosis not present

## 2024-04-08 NOTE — Telephone Encounter (Signed)
   Pre-operative Risk Assessment    Patient Name: Cambridge Deleo  DOB: [birth year] MRN: [id number]   Date of last office visit: 11/06/23 Friddie Jetty, DNP Date of next office visit: NONE   Request for Surgical Clearance    Procedure:  GONIOTOMY (GLAUCOMA SURGERY)-LEFT EYE ON 05/02/24 ; TO BE FOLLOWED BY RIGHT EYE TO BE DONE ON 05/23/24  Date of Surgery:  Clearance 05/02/24 LEFT EYE; THE RIGHT EYE TO BE DONE ON 05/23/24                              Surgeon:  DR. Josefa Ni Surgeon's Group or Practice Name:  [facility] Phone number:  [phone] Fax number:  [phone]   Type of Clearance Requested:   - Medical  - Pharmacy:  Hold Aspirin  and Clopidogrel  (Plavix ) BOTH MEDICATIONS LISTED FOR HOLD NEED TO BE HELD x 5-7 DAYS PRIOR TO SURGERY   Type of Anesthesia:  IV SEDATION   Additional requests/questions:    Princeton Broom   04/08/2024, 10:52 AM

## 2024-04-08 NOTE — Telephone Encounter (Signed)
 Pt has been 04/15/24 preop tele appt. Med rec and consent are done.

## 2024-04-08 NOTE — Telephone Encounter (Signed)
   Name: Patrick Price  DOB: 07-17-1946  MRN: [id number]  Primary Cardiologist: Magnus Schuller, MD   Preoperative team, please contact this patient and set up a phone call appointment for further preoperative risk assessment. Please obtain consent and complete medication review. Thank you for your help.  I confirm that guidance regarding antiplatelet and oral anticoagulation therapy has been completed and, if necessary, noted below.  Per office protocol, if patient is without any new symptoms or concerns at the time of their virtual visit, he/she may hold Plavix  for 5 days prior to procedure. Please resume Plavix  as soon as possible postprocedure, at the discretion of the surgeon. Regarding ASA therapy, we recommend continuation of ASA throughout the perioperative period.    I also confirmed the patient resides in the state of [place] . As per [facility] Medical Board telemedicine laws, the patient must reside in the state in which the provider is licensed.   Jude Norton, NP 04/08/2024, 10:57 AM [facility]

## 2024-04-08 NOTE — Telephone Encounter (Signed)
 Pt has been 04/15/24 preop tele appt. Med rec and consent are done.     Patient Consent for Virtual Visit        Patrick Price has provided verbal consent on 04/08/2024 for a virtual visit (video or telephone).   CONSENT FOR VIRTUAL VISIT FOR:  Patrick Price  By participating in this virtual visit I agree to the following:  I hereby voluntarily request, consent and authorize [facility] and its employed or contracted physicians, physician assistants, nurse practitioners or other licensed health care professionals (the Practitioner), to provide me with telemedicine health care services (the "Services") as deemed necessary by the treating Practitioner. I acknowledge and consent to receive the Services by the Practitioner via telemedicine. I understand that the telemedicine visit will involve communicating with the Practitioner through live audiovisual communication technology and the disclosure of certain medical information by electronic transmission. I acknowledge that I have been given the opportunity to request an in-person assessment or other available alternative prior to the telemedicine visit and am voluntarily participating in the telemedicine visit.  I understand that I have the right to withhold or withdraw my consent to the use of telemedicine in the course of my care at any time, without affecting my right to future care or treatment, and that the Practitioner or I may terminate the telemedicine visit at any time. I understand that I have the right to inspect all information obtained and/or recorded in the course of the telemedicine visit and may receive copies of available information for a reasonable fee.  I understand that some of the potential risks of receiving the Services via telemedicine include:  Delay or interruption in medical evaluation due to technological equipment failure or disruption; Information transmitted may not be sufficient (e.g. poor resolution of  images) to allow for appropriate medical decision making by the Practitioner; and/or  In rare instances, security protocols could fail, causing a breach of personal health information.  Furthermore, I acknowledge that it is my responsibility to provide information about my medical history, conditions and care that is complete and accurate to the best of my ability. I acknowledge that Practitioner's advice, recommendations, and/or decision may be based on factors not within their control, such as incomplete or inaccurate data provided by me or distortions of diagnostic images or specimens that may result from electronic transmissions. I understand that the practice of medicine is not an exact science and that Practitioner makes no warranties or guarantees regarding treatment outcomes. I acknowledge that a copy of this consent can be made available to me via my patient portal [facility] MyChart), or I can request a printed copy by calling the office of  [facility].    I understand that my insurance will be billed for this visit.   I have read or had this consent read to me. I understand the contents of this consent, which adequately explains the benefits and risks of the Services being provided via telemedicine.  I have been provided ample opportunity to ask questions regarding this consent and the Services and have had my questions answered to my satisfaction. I give my informed consent for the services to be provided through the use of telemedicine in my medical care

## 2024-04-15 ENCOUNTER — Encounter: Payer: Self-pay | Admitting: Nurse Practitioner

## 2024-04-15 ENCOUNTER — Ambulatory Visit: Attending: Cardiovascular Disease | Admitting: Nurse Practitioner

## 2024-04-15 DIAGNOSIS — Z01810 Encounter for preprocedural cardiovascular examination: Secondary | ICD-10-CM

## 2024-04-15 NOTE — Progress Notes (Signed)
 Virtual Visit via Telephone Note   Because of Patrick Price co-morbid illnesses, he is at least at moderate risk for complications without adequate follow up.  This format is felt to be most appropriate for this patient at this time.  Due to technical limitations with video connection (technology), today's appointment will be conducted as an audio only telehealth visit, and Patrick Price verbally agreed to proceed in this manner.   All issues noted in this document were discussed and addressed.  No physical exam could be performed with this format.  Evaluation Performed:  Preoperative cardiovascular risk assessment _____________   Date:  04/15/2024   Patient ID:  Patrick Price, DOB Jun 18, 1946, MRN [id number] Patient Location:  Home Provider location:   Office  Primary Care Provider:  Shelvia Dick, MD Primary Cardiologist:  Magnus Schuller, MD  Chief Complaint / Patient Profile   78 y.o. y/o male with a h/o CAD s/p PCI to RCA in 1999, PCI to LAD just proximal to prior stent with stent overlapped to previously placed bare-metal stent and stenting of 75% LAD stenosis, concomitant CAD with 40% mid LAD stenosis and 30% mid RCA stenosis, hyperlipidemia who is pending goniotomy left eye (6/30) followed by right eye (7/21) with Dr. Josefa Ni and presents today for telephonic preoperative cardiovascular risk assessment.  History of Present Illness    Patrick Price is a 78 y.o. male who presents via audio/video conferencing for a telehealth visit today.  Pt was last seen in cardiology clinic on 11/06/23 by Friddie Jetty, NP.  At that time Patrick Price was doing well.  The patient is now pending procedure as outlined above. Since his last visit, he denies chest pain, shortness of breath, lower extremity edema, fatigue, palpitations, melena, hematuria, hemoptysis, diaphoresis, weakness, presyncope, syncope, orthopnea, and PND.   Past Medical History    Past Medical History:  Diagnosis  Date   Ataxia 03/09/2015   Bell's palsy    left   CAD S/P BMS PCI-RCA 1999; 01/2019   a) 1999 - BMS PCI RCA (in [place]. Followed by Dr. Loetta Ringer); [id number]: nuclear stress test- low risk scan. no ischemia or infarct/scar is seen EF 77%. c) Mar 2020->2 Resolute Onyx DES stents to RCA, EF 55-60%, inf hypokin, elev LVEDP   CAP (community acquired pneumonia) 07/2018   Noted on noncontrast, low dose CT done for lung ca screening.   Chronic renal insufficiency, stage 3 (moderate) (HCC) 2019   GFR 50s   Colon cancer screening 2019   FIT test neg 03/21/18   Hyperlipidemia    Recommended pt restart atorva 40 mg qd 06/28/18 but he declined, stating he had intol to rosuva and atorva (?cough? per pt).   Hypertension    pt adamantly denies this dx   Loss of touch sensation on examination 03/09/2015   Polyneuropathy 03/2015   NCS/EMG 2016: symmetric length-dependent moderately severe axonal sensorimotor polyneuropathy (no ulnar or median neuropathy, no radiculopathy).   STEMI (ST elevation myocardial infarction) (HCC) 01/2019   2 Resolute Onyx DES stents to RCA: ASA + plavix , statin.  (HR too low for BB, plus pt refused ARB).   Tobacco abuse    Quit 03/2018.  (30+ pack-yr cigs).   Past Surgical History:  Procedure Laterality Date   CARDIOVASCULAR STRESS TEST  2016   Myocard perf imaging: normal (EF/LV fxn normal, no ischemia)   COLONOSCOPY  06/2006; 2014   2014 NORMAL.  Recall 10 yrs Cherene Core).  FIT test NEG 03/21/18.  CORONARY ANGIOPLASTY WITH STENT PLACEMENT  1999   RCA stent 1999 Soyla Duverney)   CORONARY/GRAFT ACUTE MI REVASCULARIZATION N/A 01/23/2019   2 Resolute Onyx DES stents to RCA. Procedure: Coronary/Graft Acute MI Revascularization;  Surgeon: Arleen Lacer, MD;  Location: [facility] INVASIVE CV LAB;  Service: Cardiovascular;  Laterality: N/A;   LEFT HEART CATH AND CORONARY ANGIOGRAPHY N/A 01/23/2019   2 Resolute Onyx DES stents to RCA. Procedure: LEFT HEART CATH AND CORONARY ANGIOGRAPHY;  Surgeon:  Arleen Lacer, MD;  Location: [facility] INVASIVE CV LAB;  Service: Cardiovascular;  Laterality: N/A;    Allergies  No Known Allergies  Home Medications    Prior to Admission medications   Medication Sig Start Date End Date Taking? Authorizing Provider  aspirin  EC 81 MG EC tablet Take 1 tablet (81 mg total) by mouth daily. 01/25/19   Bhagat, Annia Kilts, PA  atorvastatin  (LIPITOR ) 40 MG tablet TAKE 1 TABLET BY MOUTH EVERY DAY IN THE EVENING 11/03/23   Millicent Ally, MD  clopidogrel  (PLAVIX ) 75 MG tablet Take 1 tablet (75 mg total) by mouth daily. 01/01/24   Millicent Ally, MD  ezetimibe  (ZETIA ) 10 MG tablet TAKE 1 TABLET (10 MG TOTAL) BY MOUTH DAILY. PATIENT NEEDS APPOINTMENT WITH PROVIDER. 11/16/23   Millicent Ally, MD  nitroGLYCERIN  (NITROSTAT ) 0.4 MG SL tablet Place 1 tablet (0.4 mg total) under the tongue every 5 (five) minutes x 3 doses as needed for chest pain. 02/29/20   Ervin Heath, PA    Physical Exam    Vital Signs:  Patrick Price does not have vital signs available for review today.  Given telephonic nature of communication, physical exam is limited. AAOx3. NAD. Normal affect.  Speech and respirations are unlabored.  Accessory Clinical Findings    None  Assessment & Plan    1.  Preoperative Cardiovascular Risk Assessment: According to the Revised Cardiac Risk Index (RCRI), his Perioperative Risk of Major Cardiac Event is (%): 0.9. His Functional Capacity in METs is: 8.23 according to the Duke Activity Status Index (DASI). The patient is doing well from a cardiac perspective. Therefore, based on ACC/AHA guidelines, the patient would be at acceptable risk for the planned procedure without further cardiovascular testing.   The patient was advised that if he develops new symptoms prior to surgery to contact our office to arrange for a follow-up visit, and he verbalized understanding.  Per office protocol, if patient is without any new symptoms or concerns at the time of their  virtual visit, he/she may hold Plavix  for 5 days prior to procedure. Please resume Plavix  as soon as possible postprocedure, at the discretion of the surgeon. Per discussion with patient, he is concerned that he bleeds easily and would like to hold aspirin  for 5 day prior to surgery. Per office protocol, he may hold aspirin  for 5 days prior to procedure and should resume as soon as hemodynamically stable postoperatively.   A copy of this note will be routed to requesting surgeon.  Time:   Today, I have spent 10 minutes with the patient with telehealth technology discussing medical history, symptoms, and management plan.     Gerldine Koch, NP-C  04/15/2024, 9:06 AM [address], [address] [place], [place] [address] Office [phone] Fax [phone]

## 2024-04-18 NOTE — Telephone Encounter (Signed)
 Patrick Price left vm inquiring if pt has been cleared. I do see that Slater Duncan, NP cleared the pt on 04/15/24 @ 9:07 am.    I will be happy to re-fax the clearance notes to requesting office.

## 2024-05-02 DIAGNOSIS — H401123 Primary open-angle glaucoma, left eye, severe stage: Secondary | ICD-10-CM | POA: Diagnosis not present

## 2024-05-02 DIAGNOSIS — H25812 Combined forms of age-related cataract, left eye: Secondary | ICD-10-CM | POA: Diagnosis not present

## 2024-05-02 DIAGNOSIS — I2510 Atherosclerotic heart disease of native coronary artery without angina pectoris: Secondary | ICD-10-CM | POA: Diagnosis not present

## 2024-08-25 ENCOUNTER — Other Ambulatory Visit: Payer: Self-pay

## 2024-08-29 ENCOUNTER — Other Ambulatory Visit: Payer: Self-pay

## 2024-08-29 MED ORDER — EZETIMIBE 10 MG PO TABS: 10.0000 mg | ORAL_TABLET | Freq: Every day | ORAL | 0 refills | Status: AC

## 2024-09-14 ENCOUNTER — Telehealth: Payer: Self-pay

## 2024-09-14 NOTE — Telephone Encounter (Signed)
 Patient is overdue for an appointment. LMOM for patient to call and schedule.

## 2024-09-19 NOTE — Telephone Encounter (Signed)
 Patient is on the [facility] list as not completing a PCP visit for 2025. PCP listed is McGowen, Aleene DEL, MD .   Patient scheduled for 10/06/2024 at 10am.

## 2024-10-06 ENCOUNTER — Ambulatory Visit: Admitting: Family Medicine

## 2024-10-17 ENCOUNTER — Other Ambulatory Visit: Payer: Self-pay

## 2024-10-19 MED ORDER — ATORVASTATIN CALCIUM 40 MG PO TABS: 40.0000 mg | ORAL_TABLET | Freq: Every evening | ORAL | 0 refills | Status: AC

## 2024-10-21 ENCOUNTER — Telehealth: Payer: Self-pay | Admitting: Adult Health

## 2024-10-21 ENCOUNTER — Encounter: Payer: Self-pay | Admitting: *Deleted

## 2024-10-21 NOTE — Telephone Encounter (Signed)
" °*  STAT* If patient is at the pharmacy, call can be transferred to refill team.   1. Which medications need to be refilled? (please list name of each medication and dose if known)  atorvastatin  (LIPITOR ) 40 MG tablet   2. Would you like to learn more about the convenience, safety, & potential cost savings by using the [facility]? no    3. Are you open to using the [facility] (Type [facility]. no   4. Which pharmacy/location (including street and city if local pharmacy) is medication to be sent to?   5. Do they need a 30 day or 90 day supply? 90 day  Pt sched for  1/28 at 9:15am. "

## 2024-10-24 NOTE — Telephone Encounter (Signed)
"   Disp Refills Start End   atorvastatin  (LIPITOR ) 40 MG tablet 90 tablet 0 10/19/2024 --   Sig - Route: Take 1 tablet (40 mg total) by mouth every evening. - Oral   Sent to pharmacy as: atorvastatin  (LIPITOR ) 40 MG tablet   Notes to Pharmacy: Pt must schedule a followup appt with a New Cardiologist in January 2026 for any more refills. [phone] Thank You   E-Prescribing Status: Receipt confirmed by pharmacy (10/19/2024 12:11 PM EST)    "

## 2024-10-27 NOTE — Progress Notes (Unsigned)
 "     Office Note 10/28/2024  CC:  Chief Complaint  Patient presents with   Annual Exam    Pt is not fasting   Patient is a 78 y.o. male who is here for annual health maintenance exam and f/u HLD and CRI III. A/P as of last visit on 02/10/22: 1) HLD: Doing well on Zetia  10 mg a day and atorvastatin  40 mg a day. Lipid and hepatic panels today. Forward results to Dr. Burnard, his cardiologist.   2) CRI II/III: Avoiding NSAIDs.  Emphasis on good hydration Lytes/cr today.   3) coronary artery disease, history of MI and is status post PCI to RCA in 1999. Also PCI 01/2021 LAD.  Asymptomatic. Continue aspirin , Plavix , statin, Zetia . He has been following up with his cardiologist appropriately.  INTERIM HX: Feeling well. He averages 15,000 steps a day walking his dog as well as working at [employer] in [place].  He has followed up with his heart doctor in the last year, says all lab testing there was stable and he was continued on the same medication regimen--> aspirin , Plavix , statin, Zetia .  He is getting over a cold.  Just a little bit of nasal congestion and some coughing still at night.   Past Medical History:  Diagnosis Date   Ataxia 03/09/2015   Bell's palsy    left   CAD S/P BMS PCI-RCA 1999; 01/2019   a) 1999 - BMS PCI RCA (in [place]. Followed by Dr. Burnard); a)7983: nuclear stress test- low risk scan. no ischemia or infarct/scar is seen EF 77%. c) Mar 2020->2 Resolute Onyx DES stents to RCA, EF 55-60%, inf hypokin, elev LVEDP   CAP (community acquired pneumonia) 07/2018   Noted on noncontrast, low dose CT done for lung ca screening.   Chronic renal insufficiency, stage 3 (moderate) 2019   GFR 50s   Colon cancer screening 2019   FIT test neg 03/21/18   Hyperlipidemia    Recommended pt restart atorva 40 mg qd 06/28/18 but he declined, stating he had intol to rosuva and atorva (?cough? per pt).   Hypertension    pt adamantly denies this dx   Loss of touch  sensation on examination 03/09/2015   Polyneuropathy 03/2015   NCS/EMG 2016: symmetric length-dependent moderately severe axonal sensorimotor polyneuropathy (no ulnar or median neuropathy, no radiculopathy).   STEMI (ST elevation myocardial infarction) (HCC) 01/2019   2 Resolute Onyx DES stents to RCA: ASA + plavix , statin.  (HR too low for BB, plus pt refused ARB).   Tobacco abuse    Quit 03/2018.  (30+ pack-yr cigs).    Past Surgical History:  Procedure Laterality Date   CARDIOVASCULAR STRESS TEST  2016   Myocard perf imaging: normal (EF/LV fxn normal, no ischemia)   COLONOSCOPY  06/2006; 2014   2014 NORMAL.  Recall 10 yrs Gwen).  FIT test NEG 03/21/18.   CORONARY ANGIOPLASTY WITH STENT PLACEMENT  1999   RCA stent 1999 [place])   CORONARY/GRAFT ACUTE MI REVASCULARIZATION N/A 01/23/2019   2 Resolute Onyx DES stents to RCA. Procedure: Coronary/Graft Acute MI Revascularization;  Surgeon: Anner Alm ORN, MD;  Location[facility] INVASIVE CV LAB;  Service: Cardiovascular;  Laterality: N/A;   LEFT HEART CATH AND CORONARY ANGIOGRAPHY N/A 01/23/2019   2 Resolute Onyx DES stents to RCA. Procedure: LEFT HEART CATH AND CORONARY ANGIOGRAPHY;  Surgeon: Anner Alm ORN, MD;  Location: [facility] INVASIVE CV LAB;  Service: Cardiovascular;  Laterality: N/A;    Family History  Problem Relation Age of Onset   Heart attack Mother    Heart disease Mother    Cancer Neg Hx    Diabetes Neg Hx     Social History   Socioeconomic History   Marital status: Married    Spouse name: Not on file   Number of children: 2   Years of education: 12   Highest education level: 12th grade  Occupational History   Occupation: Research officer, trade union- unemployed currently   Tobacco Use   Smoking status: Former    Current packs/day: 0.00    Average packs/day: 0.1 packs/day for 50.0 years (5.0 ttl pk-yrs)    Types: Cigarettes    Start date: 02/14/1957    Quit date: 02/15/2007    Years since quitting: 17.7   Smokeless tobacco:  Never   Tobacco comments:    No  Vaping Use   Vaping status: Former  Substance and Sexual Activity   Alcohol use: Not Currently    Comment: No   Drug use: Never   Sexual activity: Yes    Birth control/protection: None    Comment: No  Other Topics Concern   Not on file  Social History Narrative   Married, one son and one daughter.   Educ: HS   OccupWater Engineer   No current tob but is vaping to try to quit cigs.   50 pack-yr hx.  Quit x 10 yrs, then restarted 2018.   No alcohol.         2-3 cups of caffeine daily   Social Drivers of Health   Tobacco Use: Medium Risk (10/28/2024)   Patient History    Smoking Tobacco Use: Former    Smokeless Tobacco Use: Never    Passive Exposure: Not on file  Financial Resource Strain: Low Risk (10/27/2024)   Overall Financial Resource Strain (CARDIA)    Difficulty of Paying Living Expenses: Not hard at all  Food Insecurity: No Food Insecurity (10/27/2024)   Epic    Worried About Radiation Protection Practitioner of Food in the Last Year: Never true    Ran Out of Food in the Last Year: Never true  Transportation Needs: No Transportation Needs (10/27/2024)   Epic    Lack of Transportation (Medical): No    Lack of Transportation (Non-Medical): No  Physical Activity: Sufficiently Active (10/27/2024)   Exercise Vital Sign    Days of Exercise per Week: 7 days    Minutes of Exercise per Session: 60 min  Stress: No Stress Concern Present (10/27/2024)   [employer] of Occupational Health - Occupational Stress Questionnaire    Feeling of Stress: Not at all  Social Connections: Moderately Isolated (10/27/2024)   Social Connection and Isolation Panel    Frequency of Communication with Friends and Family: More than three times a week    Frequency of Social Gatherings with Friends and Family: More than three times a week    Attends Religious Services: Never    Database Administrator or Organizations: No    Attends Engineer, Structural: Not  on file    Marital Status: Married  Catering Manager Violence: Not At Risk (05/20/2023)   Humiliation, Afraid, Rape, and Kick questionnaire    Fear of Current or Ex-Partner: No    Emotionally Abused: No    Physically Abused: No    Sexually Abused: No  Depression (PHQ2-9): Low Risk (10/28/2024)   Depression (PHQ2-9)    PHQ-2 Score: 0  Alcohol Screen: Not on file  Housing: Low  Risk (10/27/2024)   Epic    Unable to Pay for Housing in the Last Year: No    Number of Times Moved in the Last Year: 0    Homeless in the Last Year: No  Utilities: Not At Risk (05/16/2023)   AHC Utilities    Threatened with loss of utilities: No  Health Literacy: Adequate Health Literacy (05/20/2023)   B1300 Health Literacy    Frequency of need for help with medical instructions: Never    Outpatient Medications Prior to Visit  Medication Sig Dispense Refill   aspirin  EC 81 MG EC tablet Take 1 tablet (81 mg total) by mouth daily. 90 tablet 3   atorvastatin  (LIPITOR ) 40 MG tablet Take 1 tablet (40 mg total) by mouth every evening. 90 tablet 0   clopidogrel  (PLAVIX ) 75 MG tablet Take 1 tablet (75 mg total) by mouth daily. 90 tablet 3   ezetimibe  (ZETIA ) 10 MG tablet Take 1 tablet (10 mg total) by mouth daily. 90 tablet 0   latanoprost (XALATAN) 0.005 % ophthalmic solution Place 1 drop into both eyes every morning.     nitroGLYCERIN  (NITROSTAT ) 0.4 MG SL tablet Place 1 tablet (0.4 mg total) under the tongue every 5 (five) minutes x 3 doses as needed for chest pain. 25 tablet 3   No facility-administered medications prior to visit.    Allergies[1]  Review of Systems  Constitutional:  Negative for appetite change, chills, fatigue and fever.  HENT:  Negative for congestion, dental problem, ear pain and sore throat.   Eyes:  Negative for discharge, redness and visual disturbance.  Respiratory:  Negative for cough, chest tightness, shortness of breath and wheezing.   Cardiovascular:  Negative for chest pain,  palpitations and leg swelling.  Gastrointestinal:  Negative for abdominal pain, blood in stool, diarrhea, nausea and vomiting.  Genitourinary:  Negative for difficulty urinating, dysuria, flank pain, frequency, hematuria and urgency.  Musculoskeletal:  Negative for arthralgias, back pain, joint swelling, myalgias and neck stiffness.  Skin:  Negative for pallor and rash.  Neurological:  Negative for dizziness, speech difficulty, weakness and headaches.  Hematological:  Negative for adenopathy. Does not bruise/bleed easily.  Psychiatric/Behavioral:  Negative for confusion and sleep disturbance. The patient is not nervous/anxious.     PE;    10/28/2024   12:57 PM 11/06/2023    3:34 PM 05/20/2023    9:53 AM  Vitals with BMI  Height 5' 8.5    Weight 146 lbs 13 oz 156 lbs 10 oz 167 lbs  BMI 21.99    Systolic 113 120   Diastolic 64 62   Pulse 62 61    Gen: Alert, well appearing.  Patient is oriented to person, place, time, and situation. AFFECT: pleasant, lucid thought and speech. ENT: Ears: EACs clear, normal epithelium.  TMs with good light reflex and landmarks bilaterally.  Eyes: no injection, icteris, swelling, or exudate.  EOMI, PERRLA. Nose: no drainage or turbinate edema/swelling.  No injection or focal lesion.  Mouth: lips without lesion/swelling.  Oral mucosa pink and moist.  Dentition intact and without obvious caries or gingival swelling.  Oropharynx without erythema, exudate, or swelling.  Neck: supple/nontender.  No LAD, mass, or TM.  Carotid pulses 2+ bilaterally, without bruits. CV: RRR, no m/r/g.   LUNGS: CTA bilat, nonlabored resps, good aeration in all lung fields. ABD: soft, NT, ND, BS normal.  No hepatospenomegaly or mass.  No bruits. EXT: no clubbing, cyanosis, or edema.  Musculoskeletal: no joint swelling, erythema, warmth,  or tenderness.  ROM of all joints intact. Skin - no sores or suspicious lesions or rashes or color changes  Pertinent labs:  Lab Results   Component Value Date   TSH 1.24 08/21/2020   Lab Results  Component Value Date   WBC 7.9 11/19/2023   HGB 14.5 11/19/2023   HCT 42.6 11/19/2023   MCV 91 11/19/2023   PLT 155 11/19/2023   Lab Results  Component Value Date   CREATININE 1.38 (H) 11/19/2023   BUN 22 11/19/2023   NA 143 11/19/2023   K 4.8 11/19/2023   CL 107 (H) 11/19/2023   CO2 25 11/19/2023   Lab Results  Component Value Date   ALT 34 02/10/2022   AST 29 02/10/2022   ALKPHOS 92 02/10/2022   BILITOT 0.7 02/10/2022   Lab Results  Component Value Date   CHOL 110 11/19/2023   Lab Results  Component Value Date   HDL 45 11/19/2023   Lab Results  Component Value Date   LDLCALC 51 11/19/2023   Lab Results  Component Value Date   TRIG 63 11/19/2023   Lab Results  Component Value Date   CHOLHDL 2.4 11/19/2023   Lab Results  Component Value Date   PSA 0.24 02/25/2015   Lab Results  Component Value Date   HGBA1C 5.0 01/23/2019   ASSESSMENT AND PLAN:   #1 Health maintenance exam: Reviewed age and gender appropriate health maintenance issues (prudent diet, regular exercise, health risks of tobacco and excessive alcohol, use of seatbelts, fire alarms in home, use of sunscreen).  Also reviewed age and gender appropriate health screening as well as vaccine recommendations. Vaccines: UTD Labs: cbc, cmet, flp (NOT fasting). Prostate ca screening: no further screening d/t age. Colon ca screening: iFOB ordered today. Lung cancer screening: pt declined  2) HLD: Doing well on Zetia  10 mg a day and atorvastatin  40 mg a day. Lipid and hepatic panels today. Forward results to cardiologist.   3) CRI II/III: Avoiding NSAIDs.  Emphasis on good hydration Lytes/cr today.   4) coronary artery disease, history of MI and is status post PCI to RCA in 1999. Also PCI 01/2021 LAD.  Asymptomatic. Continue aspirin , Plavix , statin, Zetia . He has been following up with his cardiologist appropriately.  An After Visit  Summary was printed and given to the patient.  FOLLOW UP:  Return in about 1 year (around 10/28/2025) for annual CPE (fasting).  Signed:  Gerlene Hockey, MD           10/28/2024     [1] No Known Allergies  "

## 2024-10-28 ENCOUNTER — Ambulatory Visit (INDEPENDENT_AMBULATORY_CARE_PROVIDER_SITE_OTHER): Admitting: Family Medicine

## 2024-10-28 ENCOUNTER — Encounter: Payer: Self-pay | Admitting: Family Medicine

## 2024-10-28 ENCOUNTER — Ambulatory Visit: Payer: Self-pay | Admitting: Family Medicine

## 2024-10-28 VITALS — BP 113/64 | HR 62 | Temp 98.0°F | Ht 68.5 in | Wt 146.8 lb

## 2024-10-28 DIAGNOSIS — N2889 Other specified disorders of kidney and ureter: Secondary | ICD-10-CM

## 2024-10-28 DIAGNOSIS — Z0000 Encounter for general adult medical examination without abnormal findings: Secondary | ICD-10-CM

## 2024-10-28 DIAGNOSIS — I2510 Atherosclerotic heart disease of native coronary artery without angina pectoris: Secondary | ICD-10-CM | POA: Diagnosis not present

## 2024-10-28 DIAGNOSIS — E7800 Pure hypercholesterolemia, unspecified: Secondary | ICD-10-CM | POA: Diagnosis not present

## 2024-10-28 DIAGNOSIS — Z1211 Encounter for screening for malignant neoplasm of colon: Secondary | ICD-10-CM

## 2024-10-28 DIAGNOSIS — R7989 Other specified abnormal findings of blood chemistry: Secondary | ICD-10-CM

## 2024-10-28 LAB — CBC WITH DIFFERENTIAL/PLATELET
Basophils Absolute: 0 K/uL (ref 0.0–0.1)
Basophils Relative: 0.5 % (ref 0.0–3.0)
Eosinophils Absolute: 0.2 K/uL (ref 0.0–0.7)
Eosinophils Relative: 2.2 % (ref 0.0–5.0)
HCT: 40.8 % (ref 39.0–52.0)
Hemoglobin: 14 g/dL (ref 13.0–17.0)
Lymphocytes Relative: 26.8 % (ref 12.0–46.0)
Lymphs Abs: 2 K/uL (ref 0.7–4.0)
MCHC: 34.2 g/dL (ref 30.0–36.0)
MCV: 88.4 fl (ref 78.0–100.0)
Monocytes Absolute: 0.6 K/uL (ref 0.1–1.0)
Monocytes Relative: 7.4 % (ref 3.0–12.0)
Neutro Abs: 4.8 K/uL (ref 1.4–7.7)
Neutrophils Relative %: 63.1 % (ref 43.0–77.0)
Platelets: 207 K/uL (ref 150.0–400.0)
RBC: 4.61 Mil/uL (ref 4.22–5.81)
RDW: 13.4 % (ref 11.5–15.5)
WBC: 7.6 K/uL (ref 4.0–10.5)

## 2024-10-28 LAB — COMPREHENSIVE METABOLIC PANEL WITH GFR
ALT: 21 U/L (ref 3–53)
AST: 23 U/L (ref 5–37)
Albumin: 4.1 g/dL (ref 3.5–5.2)
Alkaline Phosphatase: 77 U/L (ref 39–117)
BUN: 26 mg/dL — ABNORMAL HIGH (ref 6–23)
CO2: 28 meq/L (ref 19–32)
Calcium: 9 mg/dL (ref 8.4–10.5)
Chloride: 106 meq/L (ref 96–112)
Creatinine, Ser: 1.5 mg/dL (ref 0.40–1.50)
GFR: 44.4 mL/min — ABNORMAL LOW
Glucose, Bld: 77 mg/dL (ref 70–99)
Potassium: 4.6 meq/L (ref 3.5–5.1)
Sodium: 141 meq/L (ref 135–145)
Total Bilirubin: 0.7 mg/dL (ref 0.2–1.2)
Total Protein: 6.6 g/dL (ref 6.0–8.3)

## 2024-10-28 LAB — LIPID PANEL
Cholesterol: 108 mg/dL (ref 28–200)
HDL: 42.1 mg/dL
LDL Cholesterol: 46 mg/dL (ref 10–99)
NonHDL: 65.56
Total CHOL/HDL Ratio: 3
Triglycerides: 96 mg/dL (ref 10.0–149.0)
VLDL: 19.2 mg/dL (ref 0.0–40.0)

## 2024-10-28 NOTE — Patient Instructions (Signed)
 Health Maintenance, Male  Adopting a healthy lifestyle and getting preventive care are important in promoting health and wellness. Ask your health care provider about:  The right schedule for you to have regular tests and exams.  Things you can do on your own to prevent diseases and keep yourself healthy.  What should I know about diet, weight, and exercise?  Eat a healthy diet    Eat a diet that includes plenty of vegetables, fruits, low-fat dairy products, and lean protein.  Do not eat a lot of foods that are high in solid fats, added sugars, or sodium.  Maintain a healthy weight  Body mass index (BMI) is a measurement that can be used to identify possible weight problems. It estimates body fat based on height and weight. Your health care provider can help determine your BMI and help you achieve or maintain a healthy weight.  Get regular exercise  Get regular exercise. This is one of the most important things you can do for your health. Most adults should:  Exercise for at least 150 minutes each week. The exercise should increase your heart rate and make you sweat (moderate-intensity exercise).  Do strengthening exercises at least twice a week. This is in addition to the moderate-intensity exercise.  Spend less time sitting. Even light physical activity can be beneficial.  Watch cholesterol and blood lipids  Have your blood tested for lipids and cholesterol at 78 years of age, then have this test every 5 years.  You may need to have your cholesterol levels checked more often if:  Your lipid or cholesterol levels are high.  You are older than 78 years of age.  You are at high risk for heart disease.  What should I know about cancer screening?  Many types of cancers can be detected early and may often be prevented. Depending on your health history and family history, you may need to have cancer screening at various ages. This may include screening for:  Colorectal cancer.  Prostate cancer.  Skin cancer.  Lung  cancer.  What should I know about heart disease, diabetes, and high blood pressure?  Blood pressure and heart disease  High blood pressure causes heart disease and increases the risk of stroke. This is more likely to develop in people who have high blood pressure readings or are overweight.  Talk with your health care provider about your target blood pressure readings.  Have your blood pressure checked:  Every 3-5 years if you are 24-52 years of age.  Every year if you are 3 years old or older.  If you are between the ages of 60 and 72 and are a current or former smoker, ask your health care provider if you should have a one-time screening for abdominal aortic aneurysm (AAA).  Diabetes  Have regular diabetes screenings. This checks your fasting blood sugar level. Have the screening done:  Once every three years after age 66 if you are at a normal weight and have a low risk for diabetes.  More often and at a younger age if you are overweight or have a high risk for diabetes.  What should I know about preventing infection?  Hepatitis B  If you have a higher risk for hepatitis B, you should be screened for this virus. Talk with your health care provider to find out if you are at risk for hepatitis B infection.  Hepatitis C  Blood testing is recommended for:  Everyone born from [birth year] through 1965.  Anyone  with known risk factors for hepatitis C.  Sexually transmitted infections (STIs)  You should be screened each year for STIs, including gonorrhea and chlamydia, if:  You are sexually active and are younger than 78 years of age.  You are older than 78 years of age and your health care provider tells you that you are at risk for this type of infection.  Your sexual activity has changed since you were last screened, and you are at increased risk for chlamydia or gonorrhea. Ask your health care provider if you are at risk.  Ask your health care provider about whether you are at high risk for HIV. Your health care provider  may recommend a prescription medicine to help prevent HIV infection. If you choose to take medicine to prevent HIV, you should first get tested for HIV. You should then be tested every 3 months for as long as you are taking the medicine.  Follow these instructions at home:  Alcohol use  Do not drink alcohol if your health care provider tells you not to drink.  If you drink alcohol:  Limit how much you have to 0-2 drinks a day.  Know how much alcohol is in your drink. In the [place] one drink equals one 12 oz bottle of beer (355 mL), one 5 oz glass of wine (148 mL), or one 1 oz glass of hard liquor (44 mL).  Lifestyle  Do not use any products that contain nicotine or tobacco. These products include cigarettes, chewing tobacco, and vaping devices, such as e-cigarettes. If you need help quitting, ask your health care provider.  Do not use street drugs.  Do not share needles.  Ask your health care provider for help if you need support or information about quitting drugs.  General instructions  Schedule regular health, dental, and eye exams.  Stay current with your vaccines.  Tell your health care provider if:  You often feel depressed.  You have ever been abused or do not feel safe at home.  Summary  Adopting a healthy lifestyle and getting preventive care are important in promoting health and wellness.  Follow your health care provider's instructions about healthy diet, exercising, and getting tested or screened for diseases.  Follow your health care provider's instructions on monitoring your cholesterol and blood pressure.  This information is not intended to replace advice given to you by your health care provider. Make sure you discuss any questions you have with your health care provider.  Document Revised: 03/11/2021 Document Reviewed: 03/11/2021  Elsevier Patient Education  2024 [employer].

## 2024-11-01 ENCOUNTER — Telehealth: Payer: Self-pay

## 2024-11-01 NOTE — Telephone Encounter (Signed)
" ° °  Name: Patrick Price  DOB: [birth year]  MRN: [id number]  Primary Cardiologist: Debby Sor, MD (Inactive)  Chart reviewed as part of pre-operative protocol coverage. Because of Ameir Faria Melnyk's past medical history and time since last visit, he will require a follow-up in-office visit in order to better assess preoperative cardiovascular risk.  Pre-op covering staff: - Please schedule appointment and call patient to inform them. If patient already had an upcoming appointment within acceptable timeframe, please add pre-op clearance to the appointment notes so provider is aware. - Please contact requesting surgeon's office via preferred method (i.e, phone, fax) to inform them of need for appointment prior to surgery.  Per office protocol, if patient is without any new symptoms or concerns at the time of their virtual visit, he/she may hold Plavix  for 5 days prior to procedure. Please resume Plavix  as soon as possible postprocedure, at the discretion of the surgeon. Regarding ASA therapy, we recommend continuation of ASA throughout the perioperative period   Orren LOISE Fabry, PA-C  11/01/2024, 12:10 PM   "

## 2024-11-01 NOTE — Telephone Encounter (Signed)
 Patient scheduled 12/31 with Dr. Kriste.

## 2024-11-01 NOTE — Telephone Encounter (Signed)
"  ° °  Pre-operative Risk Assessment    Patient Name: Patrick Price  DOB: 12/20/1945 MRN: [id number]   Date of last office visit: 11/06/23 Date of next office visit: 11/30/24   Request for Surgical Clearance    Procedure:  Cataract extraction by PE, IOL w/ Goniotomy-right  Date of Surgery:  Clearance 11/14/24                                Surgeon:  Dr. Mabel Hum Surgeon's Group or Practice Name:  [facility] Phone number:  [phone] Fax number:  [phone]   Type of Clearance Requested:   - Medical  - Pharmacy:  Hold Aspirin  and Clopidogrel  (Plavix ) 5-7 days prior   Type of Anesthesia:  IV sedation   Additional requests/questions:    Patrick Price   11/01/2024, 11:59 AM   "

## 2024-11-01 NOTE — Telephone Encounter (Signed)
 Left message for the pt to call back and schedule a sooner appt in office as he now is needing preop clearance as well.

## 2024-11-02 ENCOUNTER — Ambulatory Visit: Attending: Internal Medicine | Admitting: Internal Medicine

## 2024-11-02 ENCOUNTER — Encounter: Payer: Self-pay | Admitting: Internal Medicine

## 2024-11-02 VITALS — BP 124/64 | HR 51 | Ht 68.0 in | Wt 149.0 lb

## 2024-11-02 DIAGNOSIS — E782 Mixed hyperlipidemia: Secondary | ICD-10-CM

## 2024-11-02 DIAGNOSIS — Z9861 Coronary angioplasty status: Secondary | ICD-10-CM | POA: Diagnosis not present

## 2024-11-02 DIAGNOSIS — I2510 Atherosclerotic heart disease of native coronary artery without angina pectoris: Secondary | ICD-10-CM | POA: Diagnosis not present

## 2024-11-02 DIAGNOSIS — Z01810 Encounter for preprocedural cardiovascular examination: Secondary | ICD-10-CM | POA: Diagnosis not present

## 2024-11-02 NOTE — Patient Instructions (Signed)
 Medication Instructions:   No changes *If you need a refill on your cardiac medications before your next appointment, please call your pharmacy*   Lab Work: Not needed    Testing/Procedures:  Not needed  Follow-Up: At [facility], you and your health needs are our priority.  As part of our continuing mission to provide you with exceptional heart care, we have created designated Provider Care Teams.  These Care Teams include your primary Cardiologist (physician) and Advanced Practice Providers (APPs -  Physician Assistants and Nurse Practitioners) who all work together to provide you with the care you need, when you need it.     Your next appointment:   12 month(s)  The format for your next appointment:   In Person  Provider:   Emeline FORBES Calender, DO   Other Instructions   Cleared to have eye surgery

## 2024-11-02 NOTE — Progress Notes (Signed)
 " Cardiology Office Note   Date:  11/02/2024  ID:  Price, Patrick [birth year], MRN [id number] PCP: Candise Aleene DEL, MD  [facility] Providers Cardiologist:  Emeline FORBES Calender, DO     History of Present Illness Patrick Price is a 78 y.o. male who is a former patient of Dr. Burnard with a past medical history of dyslipidemia, CAD s/p bare-metal stents to RCA in 1999, NSTEMI with evolving STEMI in March 2020 with proximal DES overlapping prior bare-metal stent and DES to distal RCA with residual nonobstructive proximal LAD disease who presents today for annual follow-up and preop clearance for cataract extraction with right eye goniotomy planned 11/14/2024 with Dr. Corbin.  Today he states he is doing well.  He previously worked in [employer], retired for 2 months and now works at [employer].  He walks about 10-20,000 steps per day without any chest pain, shortness of breath or any other issues.  He also rides a bike.  He is tolerating his medications well.  No history of insulin-dependent diabetes, stroke or renal dysfunction with recent labs showing a creatinine less than 2.   ROS:  Review of Systems  All other systems reviewed and are negative.   Physical Exam  Physical Exam Vitals and nursing note reviewed.  Constitutional:      Appearance: Normal appearance.  HENT:     Head: Normocephalic and atraumatic.  Eyes:     Conjunctiva/sclera: Conjunctivae normal.  Neck:     Vascular: No carotid bruit.  Cardiovascular:     Rate and Rhythm: Normal rate and regular rhythm.  Pulmonary:     Effort: Pulmonary effort is normal.     Breath sounds: Normal breath sounds.  Musculoskeletal:        General: No swelling or tenderness.  Skin:    Coloration: Skin is not jaundiced or pale.  Neurological:     Mental Status: He is alert.     VS:  BP 124/64   Pulse (!) 51   Ht 5' 8 (1.727 m)   Wt 149 lb (67.6 kg)   SpO2 99%   BMI 22.66 kg/m          Wt Readings from Last 3 Encounters:  11/02/24 149 lb (67.6 kg)  10/28/24 146 lb 12.8 oz (66.6 kg)  11/06/23 156 lb 9.6 oz (71 kg)     EKG Interpretation Date/Time:  Wednesday November 02 2024 14:53:14 EST Ventricular Rate:  51 PR Interval:  192 QRS Duration:  84 QT Interval:  410 QTC Calculation: 377 R Axis:   53  Text Interpretation: Sinus bradycardia When compared with ECG of 06-Nov-2023 15:40, No significant change was found Confirmed by Calender Emeline [phone]) on 11/02/2024 2:55:18 PM    Studies Reviewed   Prior CV Studies: Coronary/Graft Acute MI Revascularization, Coronary/Graft Acute MI Revascularization 01/23/2019  Conclusion  Culprit Lesion #1: Prox RCA-1 lesion is 99% stenosed just proximal to prior stent. Prox RCA-2 previously placed bare-metal stent (BMS) is 30% stenosed.  A drug-eluting stent was successfully placed covering the lesion and the prior BMS stent using a STENT RESOLUTE ONYX 2.5X18. Postdilated 2.85 mm  Post intervention, there is a 0% residual stenosis.  Lesion #2: Dist RCA lesion is 75% stenosed.  A drug-eluting stent was successfully placed using a STENT RESOLUTE ONYX 2.5X15. Postdilated to 2.8 mm  Post intervention, there is a 0% residual stenosis.  Prox RCA to Mid RCA lesion is 30% stenosed. Distal to the new stent  -------------------------------------  Prox LAD lesion is 40% stenosed. Between 1st & 2nd Diag branches  The Left Ventricular Systolic Function is normal, with an estimated Left Ventricular EF is 50-55% by visual estimate. LV end diastolic pressure is mildly elevated.  A drug-eluting stent was successfully placed using a STENT RESOLUTE ONYX 2.5X15.  A drug-eluting stent was successfully placed using a STENT RESOLUTE ONYX 2.5X18.  SUMMARY  Severe single-vessel disease with 2 lesions in the proximal and distal RCA treated with 2 Resolute Onyx DES stents (2.5 mm x 15 mm distal, 2.5 mm x 18 mm proximal covering prior stent  -post postdilated to 2.8 mm)  Minimal disease elsewhere.  Preserved LVEF of 50 to 55% with inferior hypokinesis and moderately elevated LVEDP.  RECOMMENDATIONS  The patient be transferred to the CCU for overnight monitoring, but potentially be discharged as early as tomorrow.  Continue aggressive risk factor modification  Dual antiplatelet therapy per recommendations - minimum 1 yr.    Alm Clay, M.D., M.S. Interventional Cardiologist  Pager # [phone] Phone # [phone] [address] [place], [place] [address]      Risk Assessment/Calculations             ASCVD risk score: The ASCVD Risk score (Arnett DK, et al., 2019) failed to calculate for the following reasons:   Risk score cannot be calculated because patient has a medical history suggesting prior/existing ASCVD   * - Cholesterol units were assumed   Assessment and plan  Preop clearance for anticipated cataract surgery on right eye goniotomy with Dr. Corbin 11/15/2023 RCRI: 1 (ischemic heart disease) this corresponds to a 1.1% risk of major cardiac event in the perioperative period and is low risk for the anticipated low risk procedure.  Patient can hold his antiplatelets for 5 to 7 days prior to the anticipated procedure and restart as soon as possible postoperatively. CAD s/p bare-metal stents to RCA in 1999, NSTEMI with evolving STEMI in March 2020 with proximal DES overlapping prior bare-metal stent and DES to distal RCA with residual nonobstructive proximal LAD disease without angina.  On dual antiplatelet therapy with aspirin  and Plavix  and atorvastatin  40 mg.  Continue medications with brief hold as above Dyslipidemia well-controlled and at goal.  Continue atorvastatin    Follow up: 1 year          Signed, Emeline FORBES Calender, DO   "

## 2024-11-04 ENCOUNTER — Other Ambulatory Visit (INDEPENDENT_AMBULATORY_CARE_PROVIDER_SITE_OTHER)

## 2024-11-04 DIAGNOSIS — Z1211 Encounter for screening for malignant neoplasm of colon: Secondary | ICD-10-CM | POA: Diagnosis not present

## 2024-11-04 LAB — FECAL OCCULT BLOOD, IMMUNOCHEMICAL: Fecal Occult Bld: NEGATIVE

## 2024-11-04 NOTE — Telephone Encounter (Signed)
 Will forward to preop APP to review if pt has been cleared. Requesting office inquiring.

## 2024-11-04 NOTE — Telephone Encounter (Signed)
 [employer] calling to f/u on clearance. Fax : [phone]

## 2024-11-05 ENCOUNTER — Encounter: Payer: Self-pay | Admitting: Family Medicine

## 2024-11-30 ENCOUNTER — Ambulatory Visit: Admitting: Cardiology

## 2025-10-31 ENCOUNTER — Encounter: Admitting: Family Medicine
